# Patient Record
Sex: Male | Born: 2000
Health system: Southern US, Community
[De-identification: ages and names within clinical notes are randomized; demographics above are authoritative.]

## PROBLEM LIST (undated history)

## (undated) DIAGNOSIS — S5292XA Unspecified fracture of left forearm, initial encounter for closed fracture: Secondary | ICD-10-CM

## (undated) DIAGNOSIS — S62501A Fracture of unspecified phalanx of right thumb, initial encounter for closed fracture: Secondary | ICD-10-CM

## (undated) DIAGNOSIS — I517 Cardiomegaly: Secondary | ICD-10-CM

## (undated) DIAGNOSIS — S060XAA Concussion with loss of consciousness status unknown, initial encounter: Secondary | ICD-10-CM

## (undated) DIAGNOSIS — S060X9A Concussion with loss of consciousness of unspecified duration, initial encounter: Secondary | ICD-10-CM

## (undated) HISTORY — DX: Concussion with loss of consciousness status unknown, initial encounter: S06.0XAA

## (undated) HISTORY — DX: Concussion with loss of consciousness of unspecified duration, initial encounter: S06.0X9A

---
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
## 2020-11-15 ENCOUNTER — Encounter: Payer: Self-pay | Admitting: Family Medicine

## 2020-11-15 ENCOUNTER — Ambulatory Visit (INDEPENDENT_AMBULATORY_CARE_PROVIDER_SITE_OTHER): Admitting: Family Medicine

## 2020-11-15 ENCOUNTER — Other Ambulatory Visit: Payer: Self-pay

## 2020-11-15 VITALS — BP 122/68 | HR 60 | Temp 98.1°F | Resp 14 | Ht 69.0 in | Wt 154.0 lb

## 2020-11-15 DIAGNOSIS — R140 Abdominal distension (gaseous): Secondary | ICD-10-CM | POA: Diagnosis not present

## 2020-11-15 NOTE — Progress Notes (Signed)
Subjective:    Patient ID: Benjamin Bradshaw, male    DOB: 2000-06-03, 20 y.o.   MRN: [id number]  HPI For 3 days, the patient reports left-sided abdominal fullness and bloating.  He reports excessive gas.  He reports some nausea.  He reports some intestinal pain with defecation.  He states that he still having bowel movements though not quite as many as normal.  He feels full and bloated.  On exam today his abdomen is soft nontender nondistended however his bowel sounds are diminished.  There is no guarding.  There is no rebound.  His abdomen is soft.  He denies any hematuria or hematochezia.  He denies any melena.  He denies any dysuria.  He denies any vomiting.  He denies any fevers or chills.  He denies any recent illness. Past Medical History:  Diagnosis Date   Concussion    Enlarged heart    per mom, diagnosed on CXR, Evaluated with nml echo at [facility]   Fracture of thumb, right, closed    Left radial fracture    No past surgical history on file. Current Outpatient Medications on File Prior to Visit  Medication Sig Dispense Refill   EPINEPHrine 0.3 mg/0.3 mL IJ SOAJ injection Inject 0.3 mLs (0.3 mg total) into the muscle once. (Patient taking differently: Inject 0.3 mg into the muscle as needed (for allergic reactions). ) 2 Device 2   No current facility-administered medications on file prior to visit.   Allergies  Allergen Reactions   Nutritional Supplements     swelling   Other Swelling    Yellow jackets   Zyrtec [Cetirizine Hcl]     Hives    Social History   Socioeconomic History   Marital status: Single    Spouse name: Not on file   Number of children: Not on file   Years of education: Not on file   Highest education level: Not on file  Occupational History   Not on file  Tobacco Use   Smoking status: Never   Smokeless tobacco: Never  Substance and Sexual Activity   Alcohol use: No   Drug use: No   Sexual activity: Never  Other Topics Concern   Not on file   Social History Narrative   Not on file   Social Determinants of Health   Financial Resource Strain: Not on file  Food Insecurity: Not on file  Transportation Needs: Not on file  Physical Activity: Not on file  Stress: Not on file  Social Connections: Not on file  Intimate Partner Violence: Not on file     Review of Systems  All other systems reviewed and are negative.     Objective:   Physical Exam Vitals reviewed.  Constitutional:      General: He is not in acute distress.    Appearance: Normal appearance. He is well-developed and normal weight. He is not ill-appearing, toxic-appearing or diaphoretic.  HENT:     Head: Normocephalic and atraumatic.     Right Ear: Tympanic membrane, ear canal and external ear normal.     Left Ear: Tympanic membrane, ear canal and external ear normal.     Nose: Nose normal. No congestion or rhinorrhea.     Mouth/Throat:     Mouth: Mucous membranes are moist.     Pharynx: Oropharynx is clear. No oropharyngeal exudate.  Eyes:     General: No scleral icterus.       Right eye: No discharge.  Left eye: No discharge.     Conjunctiva/sclera: Conjunctivae normal.     Pupils: Pupils are equal, round, and reactive to light.  Neck:     Vascular: No JVD.  Cardiovascular:     Rate and Rhythm: Normal rate and regular rhythm.     Heart sounds: Normal heart sounds. No murmur heard.   No friction rub. No gallop.  Pulmonary:     Effort: Pulmonary effort is normal. No respiratory distress.     Breath sounds: Normal breath sounds. No stridor. No wheezing or rales.  Chest:     Chest wall: No tenderness.  Abdominal:     General: Abdomen is flat. There is no distension.     Palpations: Abdomen is soft. There is no mass.     Tenderness: There is no abdominal tenderness. There is no guarding or rebound.     Hernia: No hernia is present.  Musculoskeletal:     Cervical back: Neck supple.  Lymphadenopathy:     Cervical: No cervical adenopathy.   Neurological:     Mental Status: He is alert and oriented to person, place, and time.     Motor: No abnormal muscle tone.          Assessment & Plan:  Bloating - Plan: DG Abd 2 Views I suspect his subjective abdominal distention and bloating is due to constipation.  His exam today is completely normal.  There is no evidence of an acute abdomen.  I have recommended abdominal x-ray to evaluate further to confirm my suspicion.  He can also try MiraLAX 17 g in 8 ounces of water p.o. twice daily.  After he has a good "cleanout bowel movement", I would suspect that the bloating and pain will improve.  If not he is to let me know.  If I do not hear back from the patient I will assume that the MiraLAX samples that I gave him helped.

## 2020-11-16 ENCOUNTER — Ambulatory Visit (HOSPITAL_COMMUNITY)
Admission: RE | Admit: 2020-11-16 | Discharge: 2020-11-16 | Disposition: A | Discharge status: Home / Self Care | Source: Ambulatory Visit | Attending: Family Medicine | Admitting: Family Medicine

## 2020-11-16 ENCOUNTER — Telehealth: Payer: Self-pay

## 2020-11-16 DIAGNOSIS — R140 Abdominal distension (gaseous): Secondary | ICD-10-CM | POA: Diagnosis not present

## 2020-11-16 NOTE — Telephone Encounter (Signed)
Pt called in stating that he was prescribed miralax at his appt yesterday. Pt stated that he has taken the miralax and has had no relief. Pt wanted some advice if there is anything else he can do. Please advise.  Cb#: [phone]

## 2020-11-16 NOTE — Telephone Encounter (Signed)
Received call from patient.   Reports that he continues to have increased gas and bloating. States that he has taken 3 of the miralax doses today with no relief.   Inquired if he could take Mag Citrate. Given advise on how to use and to remain at home with use.   Advised if Sx are not improved in AM, contact office.

## 2020-11-17 ENCOUNTER — Encounter: Payer: Self-pay | Admitting: Family Medicine

## 2020-11-17 NOTE — Telephone Encounter (Signed)
Patient called to speak with nurse; prescribed meds not working for him. Please advise at [phone].

## 2020-11-17 NOTE — Telephone Encounter (Signed)
Received call from patient.   Reports that he took Miralax 4x daily and Magnesium oxide as he was not able to find Magnesium Citrate liquid.   States that he has also taken Ex-Lax with no relief.   Advised to continue Miralax. Advised to use OTC Fleet Enema.   Awaiting X-ray report.

## 2020-11-17 NOTE — Telephone Encounter (Signed)
Received following message:  Benjamin Bradshaw   4:42 PM Note Patient called to speak with nurse; prescribed meds not working for him. Please advise at [phone].      Please advise.

## 2020-11-17 NOTE — Telephone Encounter (Signed)
This encounter was created in error - please disregard.

## 2020-11-18 ENCOUNTER — Other Ambulatory Visit: Payer: Self-pay | Admitting: *Deleted

## 2020-11-18 ENCOUNTER — Ambulatory Visit (INDEPENDENT_AMBULATORY_CARE_PROVIDER_SITE_OTHER): Admitting: Family Medicine

## 2020-11-18 ENCOUNTER — Other Ambulatory Visit: Payer: Self-pay

## 2020-11-18 VITALS — BP 120/68 | HR 66 | Temp 98.2°F | Resp 16 | Ht 69.0 in | Wt 151.0 lb

## 2020-11-18 DIAGNOSIS — K589 Irritable bowel syndrome without diarrhea: Secondary | ICD-10-CM

## 2020-11-18 DIAGNOSIS — R140 Abdominal distension (gaseous): Secondary | ICD-10-CM

## 2020-11-18 MED ORDER — DICYCLOMINE HCL 20 MG PO TABS: 20.0000 mg | ORAL_TABLET | Freq: Four times a day (QID) | ORAL | 0 refills | Status: DC | PRN

## 2020-11-18 NOTE — Progress Notes (Signed)
Subjective:    Patient ID: Benjamin Bradshaw, male    DOB: Dec 13, 2000, 20 y.o.   MRN: [id number]  HPI 11/15/20 For 3 days, the patient reports left-sided abdominal fullness and bloating.  He reports excessive gas.  He reports some nausea.  He reports some intestinal pain with defecation.  He states that he still having bowel movements though not quite as many as normal.  He feels full and bloated.  On exam today his abdomen is soft nontender nondistended however his bowel sounds are diminished.  There is no guarding.  There is no rebound.  His abdomen is soft.  He denies any hematuria or hematochezia.  He denies any melena.  He denies any dysuria.  He denies any vomiting.  He denies any fevers or chills.  He denies any recent illness. At that time, my plan was:  I suspect his subjective abdominal distention and bloating is due to constipation.  His exam today is completely normal.  There is no evidence of an acute abdomen.  I have recommended abdominal x-ray to evaluate further to confirm my suspicion.  He can also try MiraLAX 17 g in 8 ounces of water p.o. twice daily.  After he has a good "cleanout bowel movement", I would suspect that the bloating and pain will improve.  If not he is to let me know.  If I do not hear back from the patient I will assume that the MiraLAX samples that I gave him helped.  11/18/20 X-ray was normal.  There was no evidence of constipation or obstruction.  Patient continues to report bloating.  He continues to have occasional nausea.  He also reports crampy left-sided abdominal pain.  Today his abdomen is soft nondistended only minimally tender with no guarding and no rebound.  He has normal bowel sounds.  He denies any fevers or chills or hematochezia or melena. Past Medical History:  Diagnosis Date   Concussion    Enlarged heart    per mom, diagnosed on CXR, Evaluated with nml echo at [facility]   Fracture of thumb, right, closed    Left radial fracture    No past  surgical history on file. No current outpatient medications on file prior to visit.   No current facility-administered medications on file prior to visit.   Allergies  Allergen Reactions   Nutritional Supplements     swelling   Other Swelling    Yellow jackets   Zyrtec [Cetirizine Hcl]     Hives    Social History   Socioeconomic History   Marital status: Single    Spouse name: Not on file   Number of children: Not on file   Years of education: Not on file   Highest education level: Not on file  Occupational History   Not on file  Tobacco Use   Smoking status: Never   Smokeless tobacco: Never  Substance and Sexual Activity   Alcohol use: No   Drug use: No   Sexual activity: Never  Other Topics Concern   Not on file  Social History Narrative   Not on file   Social Determinants of Health   Financial Resource Strain: Not on file  Food Insecurity: Not on file  Transportation Needs: Not on file  Physical Activity: Not on file  Stress: Not on file  Social Connections: Not on file  Intimate Partner Violence: Not on file     Review of Systems  All other systems reviewed and are negative.  Objective:   Physical Exam Vitals reviewed.  Constitutional:      General: He is not in acute distress.    Appearance: Normal appearance. He is well-developed and normal weight. He is not ill-appearing, toxic-appearing or diaphoretic.  HENT:     Head: Normocephalic and atraumatic.     Right Ear: Tympanic membrane, ear canal and external ear normal.     Left Ear: Tympanic membrane, ear canal and external ear normal.     Nose: Nose normal. No congestion or rhinorrhea.     Mouth/Throat:     Mouth: Mucous membranes are moist.     Pharynx: Oropharynx is clear. No oropharyngeal exudate.  Eyes:     General: No scleral icterus.       Right eye: No discharge.        Left eye: No discharge.     Conjunctiva/sclera: Conjunctivae normal.     Pupils: Pupils are equal, round, and  reactive to light.  Neck:     Vascular: No JVD.  Cardiovascular:     Rate and Rhythm: Normal rate and regular rhythm.     Heart sounds: Normal heart sounds. No murmur heard.   No friction rub. No gallop.  Pulmonary:     Effort: Pulmonary effort is normal. No respiratory distress.     Breath sounds: Normal breath sounds. No stridor. No wheezing or rales.  Chest:     Chest wall: No tenderness.  Abdominal:     General: Abdomen is flat. There is no distension.     Palpations: Abdomen is soft. There is no mass.     Tenderness: There is no abdominal tenderness. There is no guarding or rebound.     Hernia: No hernia is present.  Musculoskeletal:     Cervical back: Neck supple.  Lymphadenopathy:     Cervical: No cervical adenopathy.  Neurological:     Mental Status: He is alert and oriented to person, place, and time.     Motor: No abnormal muscle tone.          Assessment & Plan:  Bloating  Irritable bowel syndrome, unspecified type His exam today is reassuring.  There is no evidence of acute abdomen.  He primarily is dealing with bloating and some intestinal spasms.  Therefore, I will try to treat this with a probiotic.  Of asked him to pick up a probiotic over-the-counter and start to take this daily.  We will treat the intestinal spasms with Bentyl 20 mg every 6 hours as needed.  Reassess on Monday or Tuesday.  If his symptoms are improving he can then discontinue the Bentyl.  If he develops fever or bloody diarrhea that would be a suggestion of colitis and at that point I would want a CT scan so I encouraged him to go to the emergency room if those develop or to contact us immediately.

## 2020-11-18 NOTE — Telephone Encounter (Signed)
Call placed to patient and patient made aware.   Appointment scheduled.  

## 2021-03-30 ENCOUNTER — Telehealth (INDEPENDENT_AMBULATORY_CARE_PROVIDER_SITE_OTHER): Admitting: Nurse Practitioner

## 2021-03-30 ENCOUNTER — Other Ambulatory Visit: Payer: Self-pay

## 2021-03-30 ENCOUNTER — Ambulatory Visit (HOSPITAL_COMMUNITY)
Admission: RE | Admit: 2021-03-30 | Discharge: 2021-03-30 | Disposition: A | Discharge status: Home / Self Care | Source: Ambulatory Visit | Attending: Nurse Practitioner | Admitting: Nurse Practitioner

## 2021-03-30 ENCOUNTER — Encounter: Payer: Self-pay | Admitting: Nurse Practitioner

## 2021-03-30 ENCOUNTER — Telehealth: Payer: Self-pay

## 2021-03-30 DIAGNOSIS — R5381 Other malaise: Secondary | ICD-10-CM

## 2021-03-30 DIAGNOSIS — J329 Chronic sinusitis, unspecified: Secondary | ICD-10-CM

## 2021-03-30 DIAGNOSIS — R051 Acute cough: Secondary | ICD-10-CM | POA: Insufficient documentation

## 2021-03-30 DIAGNOSIS — B9689 Other specified bacterial agents as the cause of diseases classified elsewhere: Secondary | ICD-10-CM | POA: Diagnosis not present

## 2021-03-30 MED ORDER — AMOXICILLIN-POT CLAVULANATE 875-125 MG PO TABS: 1.0000 | ORAL_TABLET | Freq: Two times a day (BID) | ORAL | 0 refills | Status: AC

## 2021-03-30 NOTE — Progress Notes (Signed)
Subjective:    Patient ID: Benjamin Bradshaw, male    DOB: [birth year], 20 y.o.   MRN: [id number]  HPI: Benjamin Bradshaw is a 21 y.o. male presenting virtually for cough.  Chief Complaint  Patient presents with   Cough   UPPER RESPIRATORY TRACT INFECTION Onset: 3 weeks COVID-19 testing history: has not tested COVID-19 vaccination status: have had 2 COVID shots, has flu shot in November Fever: yes; subjective; thinks low grade Body aches: yes Chills: no Cough: yes; congested Pain with inspiration: no Shortness of breath: no Wheezing: no Chest pain: yes, with cough Chest tightness: no Chest congestion: yes Nasal congestion: yes Runny nose: no Post nasal drip: yes Sneezing: yes Sore throat: yes Swollen glands: yes Sinus pressure: yes; above eyes Headache: no Face pain: no Toothache: no Ear pain: no  Ear pressure:  yes; left eye   Eyes red/itching:no Eye drainage/crusting: no  Nausea: no  Vomiting: no Diarrhea: no  Change in appetite:  yes; decreased   Loss of taste/smell: no  Rash: no Fatigue: yes Sick contacts: no Strep contacts: no  Context: stable Recurrent sinusitis: no Treatments attempted: hydration, OTC Relief with OTC medications: at first, not anymore  Allergies  Allergen Reactions   Nutritional Supplements     swelling   Zyrtec [Cetirizine Hcl]     Hives     Outpatient Encounter Medications as of 03/30/2021  Medication Sig   amoxicillin-clavulanate (AUGMENTIN) 875-125 MG tablet Take 1 tablet by mouth 2 (two) times daily for 7 days.   dicyclomine (BENTYL) 20 MG tablet Take 1 tablet (20 mg total) by mouth every 6 (six) hours as needed (intestinal pain).   No facility-administered encounter medications on file as of 03/30/2021.    There are no problems to display for this patient.   Past Medical History:  Diagnosis Date   Concussion    Enlarged heart    per mom, diagnosed on CXR, Evaluated with nml echo at [facility]   Fracture of  thumb, right, closed    Left radial fracture     Relevant past medical, surgical, family and social history reviewed and updated as indicated. Interim medical history since our last visit reviewed.  Review of Systems Per HPI unless specifically indicated above     Objective:    There were no vitals taken for this visit.  Wt Readings from Last 3 Encounters:  11/18/20 151 lb (68.5 kg) (43 %, Z= -0.17)*  11/15/20 154 lb (69.9 kg) (48 %, Z= -0.05)*  05/28/19 141 lb (64 kg) (35 %, Z= -0.39)*   * Growth percentiles are based on CDC (Boys, 2-20 Years) data.    Physical Exam Vitals and nursing note reviewed.  Constitutional:      General: He is not in acute distress.    Appearance: He is not ill-appearing or toxic-appearing.  HENT:     Right Ear: External ear normal.     Left Ear: External ear normal.     Nose: Congestion present. No rhinorrhea.     Mouth/Throat:     Mouth: Mucous membranes are moist.     Pharynx: Oropharynx is clear.  Eyes:     General: No scleral icterus.       Right eye: No discharge.        Left eye: No discharge.  Cardiovascular:     Comments: Unable to assess heart sounds via virtual visit. Pulmonary:     Effort: Pulmonary effort is normal. No respiratory distress.  Comments: Unable to assess lung sounds via virtual visit.  Patient talking in complete sentences during telemedicine visit without accessory muscle use. Skin:    Coloration: Skin is not jaundiced or pale.     Findings: No erythema.  Neurological:     Mental Status: He is alert and oriented to person, place, and time.      Assessment & Plan:  1. Bacterial sinusitis Acute.  Start Augmentin twice daily for 7 days.  Start Guaifenesin to help loosen congestion, continue to push fluids, steam showers, humidifier, etc.  Follow up if symptoms do not improve over next week.  - amoxicillin-clavulanate (AUGMENTIN) 875-125 MG tablet; Take 1 tablet by mouth 2 (two) times daily for 7 days.   Dispense: 14 tablet; Refill: 0  2. Malaise Some concern with cough, length of symptoms, malaise, and new low grade fever.  Will check chest x-ray to rule out pneumonia.   - DG Chest 2 View; Future  3. Acute cough  - DG Chest 2 View; Future    Follow up plan: Return if symptoms worsen or fail to improve.   Due to the catastrophic nature of the COVID-19 pandemic, this video visit was completed soley via audio and visual contact via [facility] due to the restrictions of the COVID-19 pandemic.  All issues as above were discussed and addressed. Physical exam was done as above through visual confirmation on [employer]. If it was felt that the patient should be evaluated in the office, they were directed there. The patient verbally consented to this visit. Location of the patient: home Location of the provider: work Those involved with this call:  Provider: Cathlean Marseilles, DNP, FNP-C CMA: Darrol Angel, CMA Front Desk/Registration: Claudine Mouton  Time spent on call:  11 minutes with patient face to face via video conference. More than 50% of this time was spent in counseling and coordination of care. 16 minutes total spent in review of patient's record and preparation of their chart. I verified patient identity using two factors (patient name and date of birth). Patient consents verbally to being seen via telemedicine visit today.

## 2021-03-30 NOTE — Telephone Encounter (Signed)
-----   Message from Valentino Nose, NP sent at 03/30/2021  2:38 PM EST ----- Please notify chest x-ray does not show pneumonia.  Please complete entire course of Augmentin and let us know if symptoms are not better after treatment.

## 2021-03-30 NOTE — Telephone Encounter (Signed)
Left message for patient to call back regarding results and recommendations. °

## 2021-04-04 NOTE — Telephone Encounter (Signed)
Spoke with pt and reviewed results and recommendations. Pt voiced understanding. Nothing further needed at this time.

## 2022-01-14 IMAGING — DX DG CHEST 2V
2 series · 2 of 2 positions shown · non-contrast
Comparison: None.

CLINICAL DATA: 20-year-old male with cough and fatigue

EXAM:
CHEST - 2 VIEW

[chest pa]
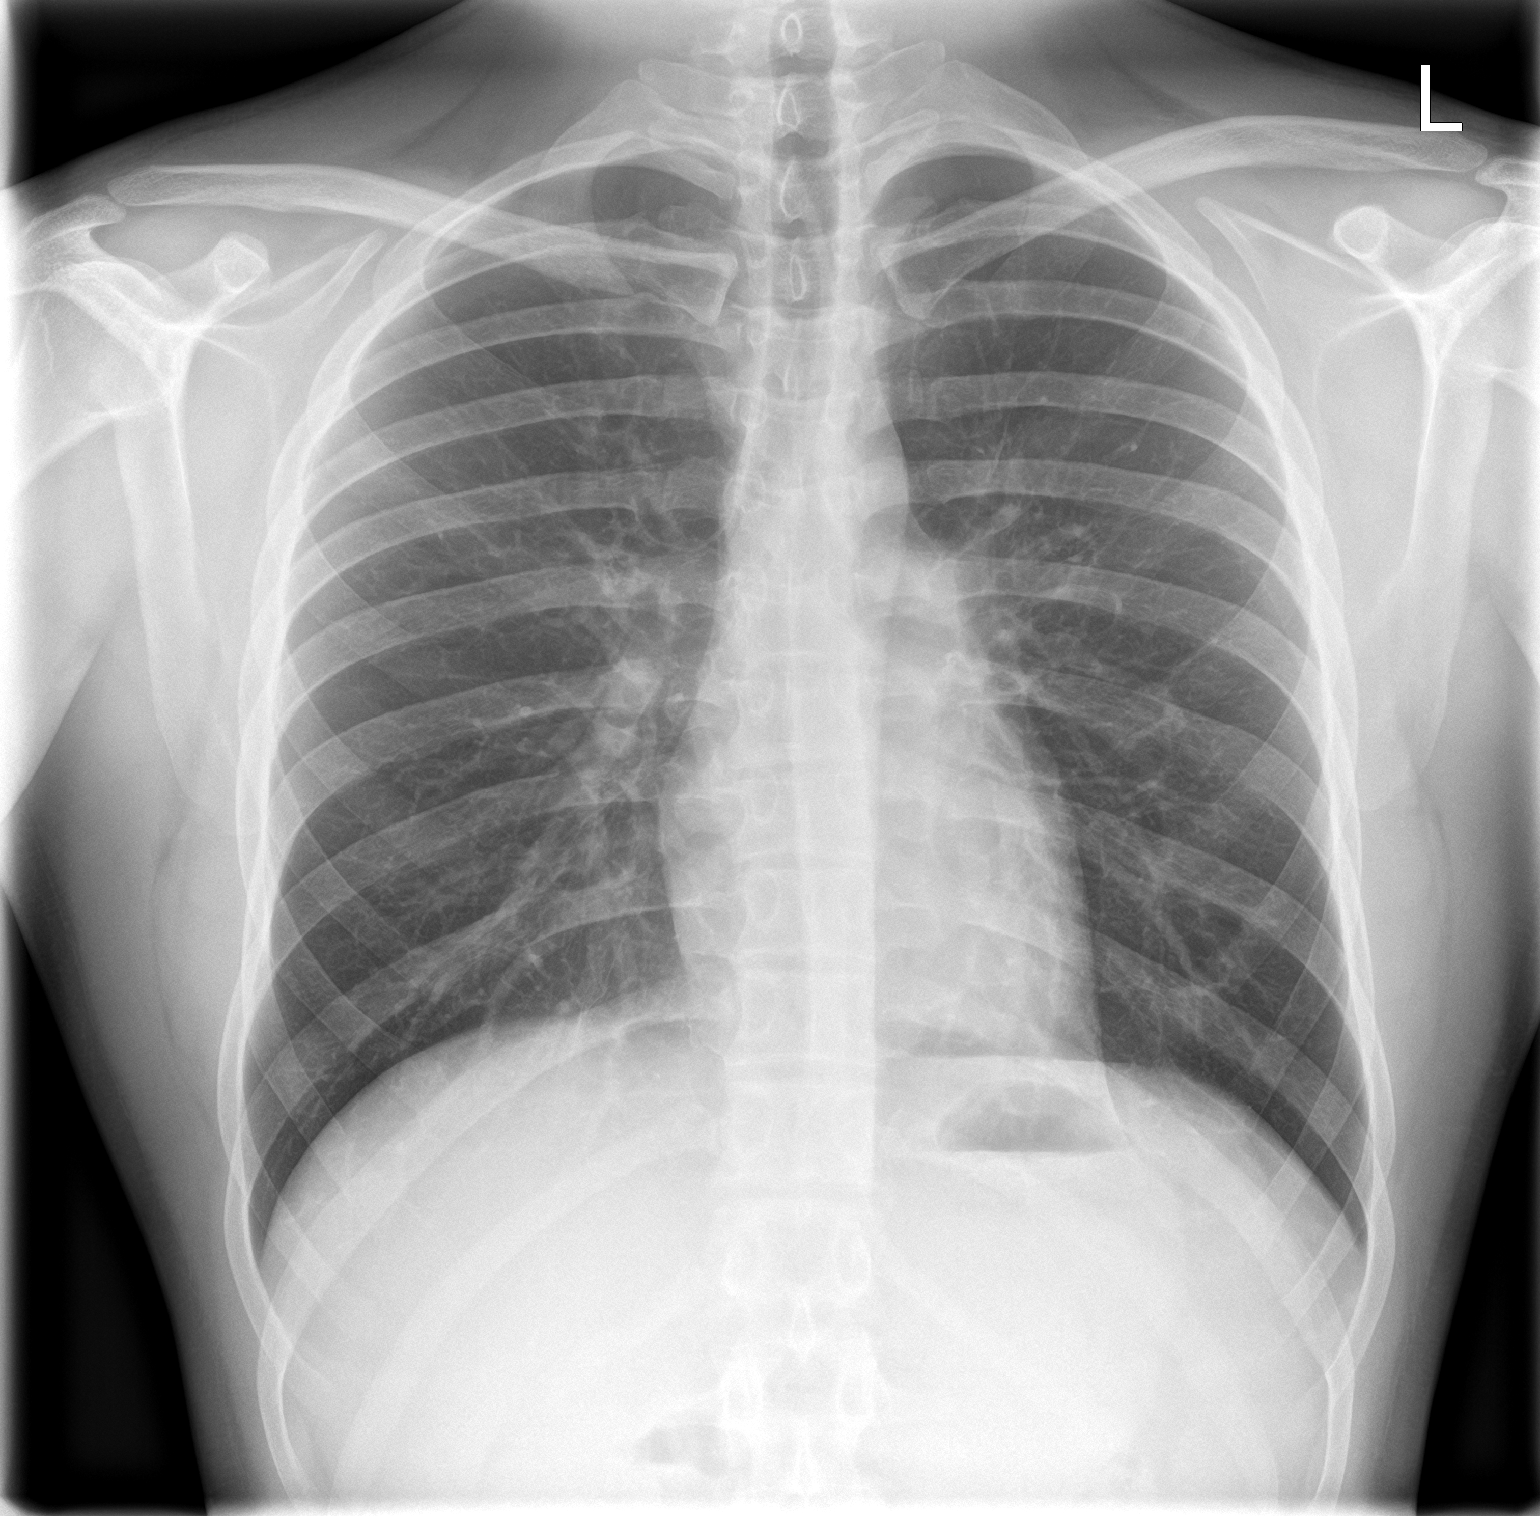

[chest lat]
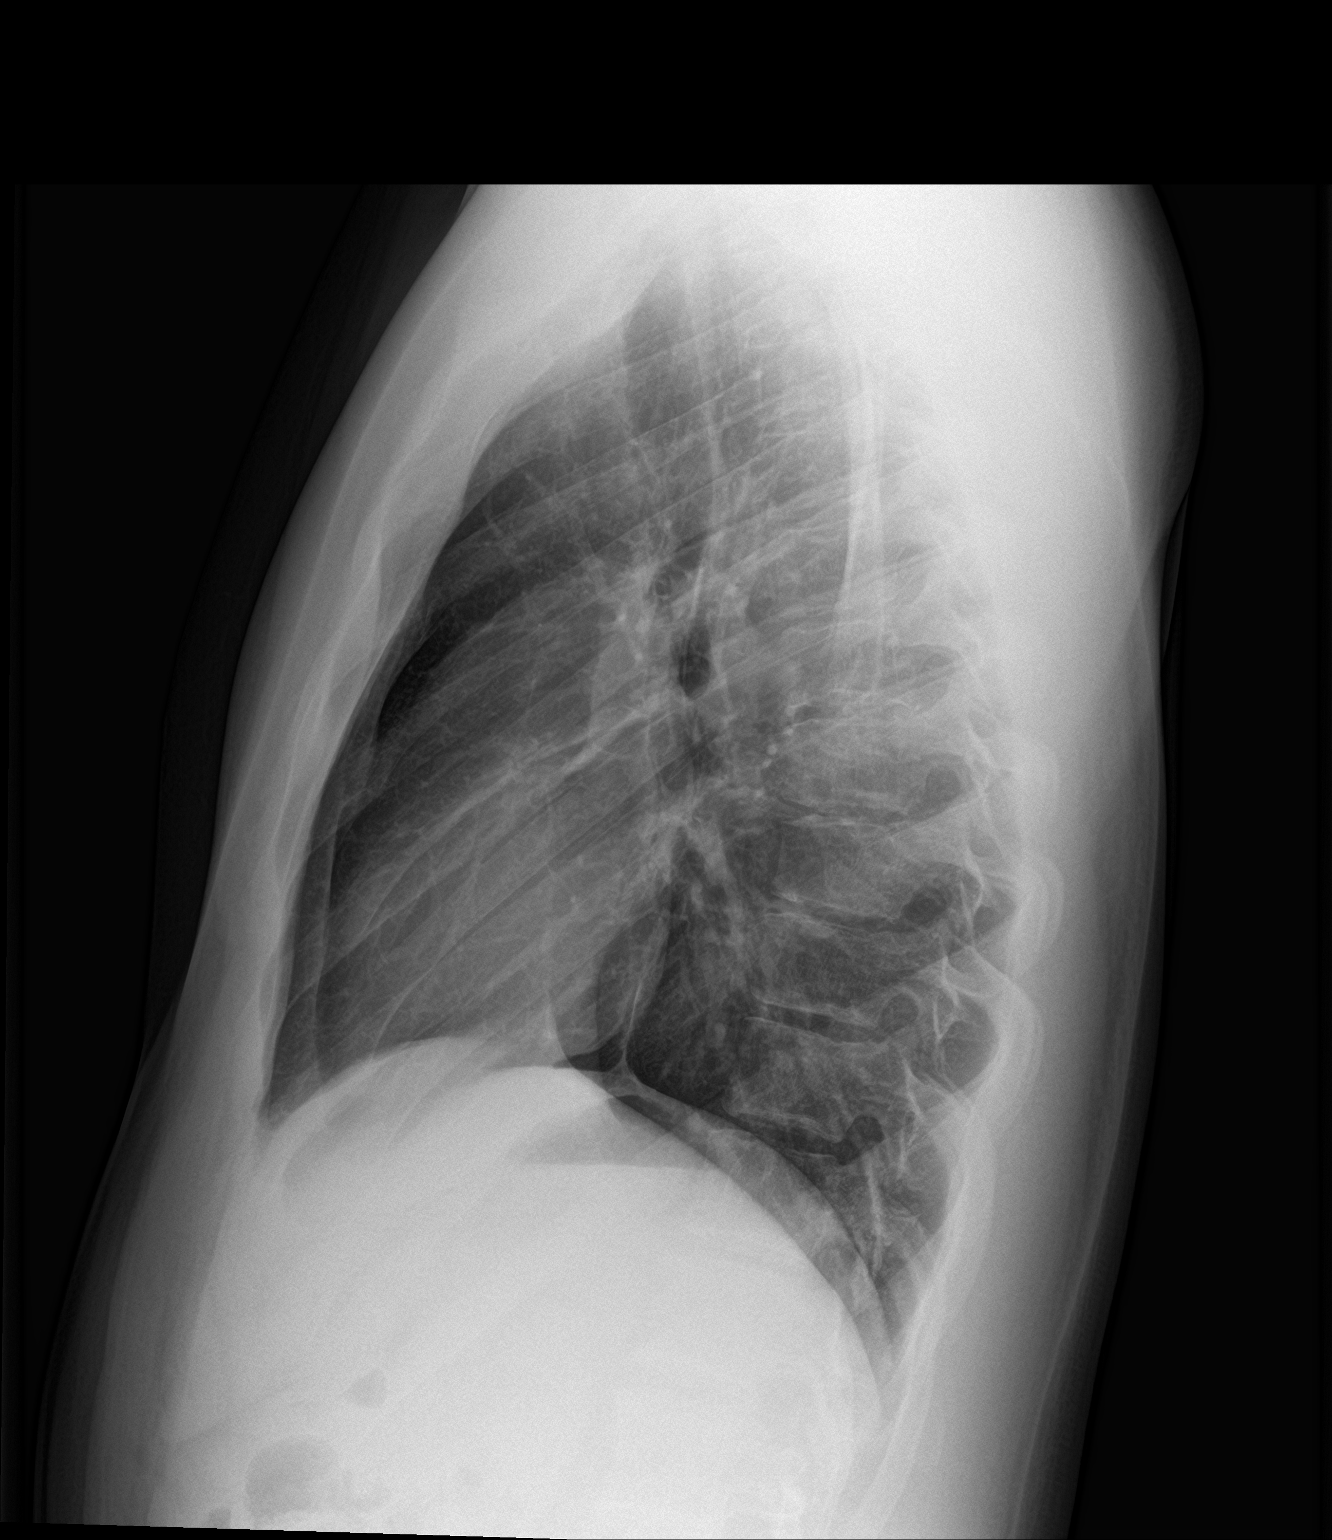

[2 of 2 positions shown; findings below may reference images not displayed]

FINDINGS: Cardiomediastinal silhouette within normal limits in size and
contour. No evidence of central vascular congestion. No interlobular
septal thickening. No pneumothorax or pleural effusion. No confluent
airspace disease.

No acute displaced fracture
IMPRESSION: No active cardiopulmonary disease.

## 2022-04-19 ENCOUNTER — Encounter: Payer: Self-pay | Admitting: Family Medicine

## 2022-04-19 ENCOUNTER — Ambulatory Visit (INDEPENDENT_AMBULATORY_CARE_PROVIDER_SITE_OTHER): Admitting: Family Medicine

## 2022-04-19 VITALS — BP 132/80 | HR 70 | Temp 98.6°F | Ht 69.0 in | Wt 189.8 lb

## 2022-04-19 DIAGNOSIS — G4452 New daily persistent headache (NDPH): Secondary | ICD-10-CM

## 2022-04-19 DIAGNOSIS — G4710 Hypersomnia, unspecified: Secondary | ICD-10-CM | POA: Diagnosis not present

## 2022-04-19 NOTE — Progress Notes (Signed)
Subjective:    Patient ID: Benjamin Bradshaw, male    DOB: 2000/05/07, 22 y.o.   MRN: [id number]  HPI Patient presents today complaining of hypersomnolence.  He states that this has been going on for years however its gotten much worse over the last few months.  He states that he gets 6 to 7 hours of sleep every day however he never feels rested.  He always wants to go to sleep.  He also finds himself falling asleep easily without warning.  He almost fell asleep driving recently.  He can easily fall asleep riding in a car or watching TV.  He often falls asleep after eating lunch at work just while flipping through his cell phone.  He denies any syncopal episodes except for once after mid duration.  He denies any seizure activity.  He does report dull constant headaches.  The headaches are located over both temples.  They are pressure-like headaches.  They have been getting increasingly worse over the last month or so.  He denies any photophobia or phonophobia.  He denies any neurologic deficits.  He does have a history of several concussions as a child.  He states that he had an intracranial hemorrhage when he was young at [facility] that did not require surgery to evacuate the blood.  He states that they were small bleeds.  Past Medical History:  Diagnosis Date   Concussion    Enlarged heart    per mom, diagnosed on CXR, Evaluated with nml echo at [facility]   Fracture of thumb, right, closed    Left radial fracture    No past surgical history on file. Current Outpatient Medications on File Prior to Visit  Medication Sig Dispense Refill   dicyclomine (BENTYL) 20 MG tablet Take 1 tablet (20 mg total) by mouth every 6 (six) hours as needed (intestinal pain). 30 tablet 0   No current facility-administered medications on file prior to visit.   Allergies  Allergen Reactions   Nutritional Supplements     swelling   Zyrtec [Cetirizine Hcl]     Hives    Social History   Socioeconomic History    Marital status: Single    Spouse name: Not on file   Number of children: Not on file   Years of education: Not on file   Highest education level: Not on file  Occupational History   Not on file  Tobacco Use   Smoking status: Never   Smokeless tobacco: Never  Substance and Sexual Activity   Alcohol use: No   Drug use: No   Sexual activity: Never  Other Topics Concern   Not on file  Social History Narrative   Not on file   Social Determinants of Health   Financial Resource Strain: Not on file  Food Insecurity: Not on file  Transportation Needs: Not on file  Physical Activity: Not on file  Stress: Not on file  Social Connections: Not on file  Intimate Partner Violence: Not on file     Review of Systems  All other systems reviewed and are negative.      Objective:   Physical Exam Vitals reviewed.  Constitutional:      General: He is not in acute distress.    Appearance: Normal appearance. He is well-developed and normal weight. He is not ill-appearing, toxic-appearing or diaphoretic.  HENT:     Head: Normocephalic and atraumatic.     Right Ear: Tympanic membrane, ear canal and external ear normal.  Left Ear: Tympanic membrane, ear canal and external ear normal.     Nose: Nose normal. No congestion or rhinorrhea.     Mouth/Throat:     Mouth: Mucous membranes are moist.     Pharynx: Oropharynx is clear. No oropharyngeal exudate.  Eyes:     General: No scleral icterus.       Right eye: No discharge.        Left eye: No discharge.     Conjunctiva/sclera: Conjunctivae normal.     Pupils: Pupils are equal, round, and reactive to light.  Neck:     Vascular: No JVD.  Cardiovascular:     Rate and Rhythm: Normal rate and regular rhythm.     Heart sounds: Normal heart sounds. No murmur heard.    No friction rub. No gallop.  Pulmonary:     Effort: Pulmonary effort is normal. No respiratory distress.     Breath sounds: Normal breath sounds. No stridor. No wheezing or  rales.  Chest:     Chest wall: No tenderness.  Abdominal:     General: Abdomen is flat. There is no distension.     Palpations: Abdomen is soft. There is no mass.     Tenderness: There is no abdominal tenderness. There is no guarding or rebound.     Hernia: No hernia is present.  Musculoskeletal:     Cervical back: Neck supple.  Lymphadenopathy:     Cervical: No cervical adenopathy.  Neurological:     Mental Status: He is alert and oriented to person, place, and time.     Motor: No abnormal muscle tone.           Assessment & Plan:  Hypersomnolence  New daily persistent headache Patient has had hypersomnolence for quite some time but is gotten worse recently almost to the point that he fell asleep driving once.  For this reason, I would recommend neurology consultation for a sleep study.  He denies any seizure-like activity.  He denies any staring spells or absence seizure's.  However I am concerned about central versus obstructive sleep apnea possibly causing his hypersomnolence.  This could also potentially explain his new persistent daily headache.  The other cause of the headache could be chronic tinnitus versus atypical migraines.  However I would like to evaluate the hypersomnolence first given that he almost fell asleep driving.  If he does have severe apnea, this could potentially treat multiple symptoms that he is explaining.  Therefore I will consult neurology

## 2022-04-25 ENCOUNTER — Other Ambulatory Visit: Payer: Self-pay

## 2022-04-25 ENCOUNTER — Telehealth: Payer: Self-pay

## 2022-04-25 NOTE — Telephone Encounter (Signed)
[facility] requesting for [facility] order for sleep study for pt. Can't use the of Referral. Also need DX and if it's an in clinic or at home study?  Fax#[phone]  Pls advice

## 2022-04-26 ENCOUNTER — Other Ambulatory Visit: Payer: Self-pay

## 2022-04-26 DIAGNOSIS — G4710 Hypersomnia, unspecified: Secondary | ICD-10-CM

## 2022-04-26 NOTE — Progress Notes (Signed)
Error entry

## 2022-04-26 NOTE — Telephone Encounter (Signed)
Home sleep study order [facility] to [facility] this morning atten Coralyn Mark

## 2022-05-18 ENCOUNTER — Ambulatory Visit: Payer: 59 | Attending: Family Medicine | Admitting: Pulmonary Disease

## 2022-05-18 DIAGNOSIS — G4710 Hypersomnia, unspecified: Secondary | ICD-10-CM | POA: Insufficient documentation

## 2022-05-30 DIAGNOSIS — G4710 Hypersomnia, unspecified: Secondary | ICD-10-CM

## 2022-05-30 NOTE — Procedures (Signed)
Patient Name: Benjamin Bradshaw, [place] Date: 05/18/2022 Gender: Male D.O.B: Feb 07, 2001 Age (years): 21 Referring Provider: Cletus Gash T. Pickard Height (inches): 69 Interpreting Physician: Kara Mead MD, ABSM Weight (lbs): 189 RPSGT: Rosebud Poles BMI: 28 MRN: [id number] Neck Size: <br> <br> <br> CLINICAL INFORMATION Sleep Study Type: HST    Indication for sleep study: N/A    Epworth Sleepiness Score: N/A  SLEEP STUDY TECHNIQUE A multi-channel overnight portable sleep study was performed. The channels recorded were: nasal airflow, thoracic respiratory movement, and oxygen saturation with a pulse oximetry. Snoring was also monitored.  MEDICATIONS Patient self administered medications include: N/A.  SLEEP ARCHITECTURE Patient was studied for 481.9 minutes. The sleep efficiency was 73.0 % and the patient was supine for 0%. The arousal index was 0.0 per hour.  RESPIRATORY PARAMETERS The overall AHI was 4.1 per hour, with a central apnea index of 0 per hour.  The oxygen nadir was 89% during sleep.    CARDIAC DATA Mean heart rate during sleep was 58.0 bpm.  IMPRESSIONS - No significant obstructive sleep apnea occurred during this study (AHI = 4.1/h). - Mild oxygen desaturation was noted during this study (Min O2 = 89%). - Patient snored 6.8% during the sleep.  DIAGNOSIS - Normal study  RECOMMENDATIONS - Avoid alcohol, sedatives and other CNS depressants that may  disrupt normal sleep architecture. - Sleep hygiene should be reviewed to assess factors that may improve sleep quality. - Weight management and regular exercise should be initiated or continued. - Consider other causes of hypersomnolence in this age group such as circadian rhythm disorder or narcolepsy. Patient may benefit from in-lab study   Kara Mead MD Board Certified in [place]

## 2022-06-21 ENCOUNTER — Telehealth: Payer: Self-pay

## 2022-06-21 NOTE — Telephone Encounter (Signed)
Pt called in stating that he wanted to discuss the results of his sleep study. Please advise  Cb#: [phone]

## 2022-06-26 ENCOUNTER — Ambulatory Visit (HOSPITAL_COMMUNITY)
Admission: RE | Admit: 2022-06-26 | Discharge: 2022-06-26 | Disposition: A | Discharge status: Home / Self Care | Payer: 59 | Source: Ambulatory Visit | Attending: Family Medicine | Admitting: Family Medicine

## 2022-06-26 ENCOUNTER — Ambulatory Visit (INDEPENDENT_AMBULATORY_CARE_PROVIDER_SITE_OTHER): Payer: 59 | Admitting: Family Medicine

## 2022-06-26 VITALS — BP 142/92 | HR 76 | Temp 99.2°F | Ht 69.0 in | Wt 187.6 lb

## 2022-06-26 DIAGNOSIS — R5383 Other fatigue: Secondary | ICD-10-CM | POA: Diagnosis not present

## 2022-06-26 DIAGNOSIS — M545 Low back pain, unspecified: Secondary | ICD-10-CM | POA: Diagnosis present

## 2022-06-26 LAB — CBC WITH DIFFERENTIAL/PLATELET
Absolute Monocytes: 579 cells/uL (ref 200–950)
Eosinophils Absolute: 78 cells/uL (ref 15–500)
Hemoglobin: 15.5 g/dL (ref 13.2–17.1)
Lymphs Abs: 1872 cells/uL (ref 850–3900)
MCH: 30 pg (ref 27.0–33.0)
Monocytes Relative: 8.9 %
Neutrophils Relative %: 60.9 %
RBC: 5.17 10*6/uL (ref 4.20–5.80)
RDW: 12.5 % (ref 11.0–15.0)
WBC: 6.5 10*3/uL (ref 3.8–10.8)

## 2022-06-26 NOTE — Progress Notes (Signed)
Subjective:    Patient ID: Benjamin Bradshaw, male    DOB: 01-Jan-2001, 22 y.o.   MRN: [id number]  HPI Patient continues to report hypersomnolence and fatigue.  I will order a sleep study.  Patient had an apnea-hypopnea index of 4.1 with 33 total events and 8 hours.  Therefore he does not appear to have sleep apnea.  He reports feeling tired all the time.  He feels like he never gets enough sleep.  However he is going to sleep roughly around 10:00 and sleeping till about 5:00 every day so he is averaging about 7 hours.  He denies any insomnia.  He is concerned that there may be something else causing his fatigue.  He also reports a 3-week history of pain in his lower back.  The pain is located left of midline just above his left gluteus muscle.  He denies any radiation of the pain into his posterior left hip or down his left leg.  There is no saddle anesthesia, paresthesias, bowel or bladder incontinence or leg weakness. Past Medical History:  Diagnosis Date  . Concussion   . Enlarged heart    per mom, diagnosed on CXR, Evaluated with nml echo at [facility]  . Fracture of thumb, right, closed   . Left radial fracture    No past surgical history on file. No current outpatient medications on file prior to visit.   No current facility-administered medications on file prior to visit.   Allergies  Allergen Reactions  . Nutritional Supplements     swelling  . Zyrtec [Cetirizine Hcl]     Hives    Social History   Socioeconomic History  . Marital status: Single    Spouse name: Not on file  . Number of children: Not on file  . Years of education: Not on file  . Highest education level: Some college, no degree  Occupational History  . Not on file  Tobacco Use  . Smoking status: Never  . Smokeless tobacco: Never  Substance and Sexual Activity  . Alcohol use: No  . Drug use: No  . Sexual activity: Never  Other Topics Concern  . Not on file  Social History Narrative  . Not on file    Social Determinants of Health   Financial Resource Strain: Medium Risk (06/26/2022)   Overall Financial Resource Strain (CARDIA)   . Difficulty of Paying Living Expenses: Somewhat hard  Food Insecurity: Food Insecurity Present (06/26/2022)   Hunger Vital Sign   . Worried About Programme researcher, broadcasting/film/video in the Last Year: Never true   . Ran Out of Food in the Last Year: Sometimes true  Transportation Needs: No Transportation Needs (06/26/2022)   PRAPARE - Transportation   . Lack of Transportation (Medical): No   . Lack of Transportation (Non-Medical): No  Physical Activity: Sufficiently Active (06/26/2022)   Exercise Vital Sign   . Days of Exercise per Week: 7 days   . Minutes of Exercise per Session: 30 min  Stress: No Stress Concern Present (06/26/2022)   [employer] of Occupational Health - Occupational Stress Questionnaire   . Feeling of Stress : Only a little  Social Connections: Socially Integrated (06/26/2022)   Social Connection and Isolation Panel [NHANES]   . Frequency of Communication with Friends and Family: More than three times a week   . Frequency of Social Gatherings with Friends and Family: Once a week   . Attends Religious Services: More than 4 times per year   .  Active Member of Clubs or Organizations: Yes   . Attends Banker Meetings: More than 4 times per year   . Marital Status: Married  Catering manager Violence: Not on file     Review of Systems  All other systems reviewed and are negative.      Objective:   Physical Exam Vitals reviewed.  Constitutional:      General: He is not in acute distress.    Appearance: Normal appearance. He is well-developed and normal weight. He is not ill-appearing, toxic-appearing or diaphoretic.  HENT:     Head: Normocephalic and atraumatic.     Right Ear: External ear normal.     Left Ear: External ear normal.     Nose: Nose normal. No congestion or rhinorrhea.     Mouth/Throat:     Mouth: Mucous membranes are  moist.     Pharynx: Oropharynx is clear. No oropharyngeal exudate.  Eyes:     General: No scleral icterus.       Right eye: No discharge.        Left eye: No discharge.     Conjunctiva/sclera: Conjunctivae normal.     Pupils: Pupils are equal, round, and reactive to light.  Neck:     Vascular: No JVD.  Cardiovascular:     Rate and Rhythm: Normal rate and regular rhythm.     Heart sounds: Normal heart sounds. No murmur heard.    No friction rub. No gallop.  Pulmonary:     Effort: Pulmonary effort is normal. No respiratory distress.     Breath sounds: Normal breath sounds. No stridor. No wheezing or rales.  Chest:     Chest wall: No tenderness.  Abdominal:     General: Abdomen is flat. There is no distension.     Palpations: Abdomen is soft. There is no mass.     Tenderness: There is no abdominal tenderness. There is no guarding or rebound.     Hernia: No hernia is present.  Musculoskeletal:     Cervical back: Neck supple.     Lumbar back: No tenderness or bony tenderness. Normal range of motion.       Back:  Lymphadenopathy:     Cervical: No cervical adenopathy.  Neurological:     Mental Status: He is alert and oriented to person, place, and time.     Motor: No abnormal muscle tone.          Assessment & Plan:  Fatigue, unspecified type - Plan: CBC with Differential/Platelet, Vitamin B12, COMPLETE METABOLIC PANEL WITH GFR, TSH, Testosterone Total,Free,Bio, Males  Acute midline low back pain without sciatica - Plan: DG Lumbar Spine Complete Sleep study did not provide explanation for his fatigue or hypersomnolence.  I do not feel that the patient has narcolepsy.  I will start of workup for biochemical causes of fatigue including a CBC, CMP, TSH, B12, and testosterone level.  If labs are normal, I feel that this would rule out hormonal causes of fatigue.  I believe the back pain is likely a pulled muscle.  Anticipate gradual improvement over the next 2 to 3 weeks but I will  obtain an x-ray of the lower back to be thorough.

## 2022-06-27 LAB — CBC WITH DIFFERENTIAL/PLATELET
Basophils Absolute: 13 cells/uL (ref 0–200)
Basophils Relative: 0.2 %
Eosinophils Relative: 1.2 %
HCT: 44.9 % (ref 38.5–50.0)
MCHC: 34.5 g/dL (ref 32.0–36.0)
MCV: 86.8 fL (ref 80.0–100.0)
MPV: 9.9 fL (ref 7.5–12.5)
Neutro Abs: 3959 cells/uL (ref 1500–7800)
Platelets: 271 10*3/uL (ref 140–400)
Total Lymphocyte: 28.8 %

## 2022-06-27 LAB — COMPLETE METABOLIC PANEL WITH GFR
AG Ratio: 2.2 (calc) (ref 1.0–2.5)
ALT: 19 U/L (ref 9–46)
AST: 15 U/L (ref 10–40)
Albumin: 4.9 g/dL (ref 3.6–5.1)
Alkaline phosphatase (APISO): 84 U/L (ref 36–130)
BUN/Creatinine Ratio: 15 (calc) (ref 6–22)
BUN: 22 mg/dL (ref 7–25)
CO2: 26 mmol/L (ref 20–32)
Calcium: 9.4 mg/dL (ref 8.6–10.3)
Chloride: 103 mmol/L (ref 98–110)
Creat: 1.44 mg/dL — ABNORMAL HIGH (ref 0.60–1.24)
Globulin: 2.2 g/dL (calc) (ref 1.9–3.7)
Glucose, Bld: 88 mg/dL (ref 65–99)
Potassium: 4.6 mmol/L (ref 3.5–5.3)
Sodium: 140 mmol/L (ref 135–146)
Total Bilirubin: 0.4 mg/dL (ref 0.2–1.2)
Total Protein: 7.1 g/dL (ref 6.1–8.1)
eGFR: 71 mL/min/{1.73_m2} (ref >=60)

## 2022-06-27 LAB — VITAMIN B12: Vitamin B-12: 640 pg/mL (ref 200–1100)

## 2022-06-27 LAB — TESTOSTERONE TOTAL,FREE,BIO, MALES
Albumin: 4.9 g/dL (ref 3.6–5.1)
Sex Hormone Binding: 25 nmol/L (ref 10–50)
Testosterone, Bioavailable: 120.1 ng/dL (ref 110.0–575.0)
Testosterone, Free: 53.9 pg/mL (ref 46.0–224.0)
Testosterone: 347 ng/dL (ref 250–827)

## 2022-06-27 LAB — TSH: TSH: 1.8 mIU/L (ref 0.40–4.50)

## 2022-06-28 ENCOUNTER — Other Ambulatory Visit: Payer: Self-pay

## 2022-06-28 DIAGNOSIS — M545 Low back pain, unspecified: Secondary | ICD-10-CM

## 2022-06-28 DIAGNOSIS — R7989 Other specified abnormal findings of blood chemistry: Secondary | ICD-10-CM

## 2022-07-14 ENCOUNTER — Emergency Department (HOSPITAL_COMMUNITY): Payer: 59

## 2022-07-14 ENCOUNTER — Encounter (HOSPITAL_COMMUNITY): Payer: Self-pay

## 2022-07-14 ENCOUNTER — Emergency Department (HOSPITAL_COMMUNITY)
Admission: EM | Admit: 2022-07-14 | Discharge: 2022-07-14 | Disposition: A | Discharge status: Home / Self Care | Payer: 59 | Attending: Emergency Medicine | Admitting: Emergency Medicine

## 2022-07-14 DIAGNOSIS — X501XXA Overexertion from prolonged static or awkward postures, initial encounter: Secondary | ICD-10-CM | POA: Insufficient documentation

## 2022-07-14 DIAGNOSIS — S92155A Nondisplaced avulsion fracture (chip fracture) of left talus, initial encounter for closed fracture: Secondary | ICD-10-CM | POA: Insufficient documentation

## 2022-07-14 DIAGNOSIS — S92002A Unspecified fracture of left calcaneus, initial encounter for closed fracture: Secondary | ICD-10-CM | POA: Diagnosis not present

## 2022-07-14 DIAGNOSIS — Y9344 Activity, trampolining: Secondary | ICD-10-CM | POA: Diagnosis not present

## 2022-07-14 DIAGNOSIS — W19XXXA Unspecified fall, initial encounter: Secondary | ICD-10-CM

## 2022-07-14 DIAGNOSIS — S99912A Unspecified injury of left ankle, initial encounter: Secondary | ICD-10-CM | POA: Diagnosis present

## 2022-07-14 MED ORDER — OXYCODONE-ACETAMINOPHEN 5-325 MG PO TABS
1.0000 | ORAL_TABLET | Freq: Once | ORAL | Status: AC
  Administered 2022-07-14: 1 via ORAL
  Filled 2022-07-14: qty 1

## 2022-07-14 MED ORDER — OXYCODONE-ACETAMINOPHEN 5-325 MG PO TABS: 1.0000 | ORAL_TABLET | Freq: Four times a day (QID) | ORAL | 0 refills | Status: AC | PRN

## 2022-07-14 NOTE — Progress Notes (Signed)
Orthopedic Tech Progress Note Patient Details:  Benjamin Bradshaw [birth year] [id number]  Ortho Devices Type of Ortho Device: Post (short leg) splint, Stirrup splint, Other (comment) Ortho Device/Splint Location: lle bulky jones under splint Ortho Device/Splint Interventions: Ordered, Application, Adjustment  The ed tech held for me. Post Interventions Patient Tolerated: Well Instructions Provided: Care of device, Adjustment of device  [name] Post 07/14/2022, 11:31 PM

## 2022-07-14 NOTE — ED Triage Notes (Signed)
Pt came in via POV d/t jumping on trampoline & when he landed his Lt ankle bent into the side & he heard a loud pop. A/Ox4, rates pain 4/10, a large knot noted to the top of that foot & pt re-ports decreased sensation.

## 2022-07-14 NOTE — Discharge Instructions (Addendum)
It was a pleasure taking care of you here in the emergency department today  You broke part of your heel bone as well as development from it called the talus.  These are minor fractures however you placed you in a splint and crutches.  Make sure to ice and elevate the leg.  Do not walk on it.  Follow-up with orthopedic doctor.  I have listed on your discharge paperwork if you do not have 1.  Return for new or worsening symptoms

## 2022-07-14 NOTE — ED Notes (Signed)
Ortho tech called 

## 2022-07-14 NOTE — ED Provider Notes (Signed)
[place] EMERGENCY DEPARTMENT AT [facility] Provider Note   CSN: [id number] Arrival date & time: 07/14/22  1833    History  Chief Complaint  Patient presents with   Lt Ankle Pain    Benjamin Bradshaw is a 22 y.o. male with no significant past medical history here for evaluation of left ankle and foot pain.  Was at a trampoline park when he landed "wrong".  Felt a pop sensation to lateral aspect foot and ankle and noted a large "knot".  He has increased pain.  Unable to ambulate.  Range of motion decreased secondary to pain.  Decreased sensation over area of swelling to the dorsal aspect left foot.  HPI     Home Medications Prior to Admission medications   Medication Sig Start Date End Date Taking? Authorizing Provider  oxyCODONE-acetaminophen (PERCOCET/ROXICET) 5-325 MG tablet Take 1 tablet by mouth every 6 (six) hours as needed for severe pain. 07/14/22  Yes Starlet Gallentine A, PA-C      Allergies    Nutritional supplements and Zyrtec [cetirizine hcl]    Review of Systems   Review of Systems  Constitutional: Negative.   HENT: Negative.    Respiratory: Negative.    Cardiovascular: Negative.   Gastrointestinal: Negative.   Genitourinary: Negative.   Musculoskeletal:        Left foot / ankle pain  Skin: Negative.   Neurological: Negative.   All other systems reviewed and are negative.   Physical Exam Updated Vital Signs BP 129/69 (BP Location: Left Arm)   Pulse 65   Temp 98 F (36.7 C) (Oral)   Resp 18   SpO2 97%  Physical Exam Vitals and nursing note reviewed.  Constitutional:      General: He is not in acute distress.    Appearance: He is well-developed. He is not ill-appearing, toxic-appearing or diaphoretic.  HENT:     Head: Atraumatic.  Eyes:     Pupils: Pupils are equal, round, and reactive to light.  Cardiovascular:     Rate and Rhythm: Normal rate and regular rhythm.     Pulses: Normal pulses.          Radial pulses are 2+ on the right  side and 2+ on the left side.       Dorsalis pedis pulses are 2+ on the left side.       Posterior tibial pulses are 2+ on the left side.  Pulmonary:     Effort: Pulmonary effort is normal. No respiratory distress.  Abdominal:     General: There is no distension.     Palpations: Abdomen is soft.  Musculoskeletal:        General: Normal range of motion.     Cervical back: Normal range of motion and neck supple.     Comments: Soft tissue swelling to dorsum left lateral foot foot.  No open skin, compartments soft.  Diffuse tenderness lateral aspect foot and lateral malleolus.  Wiggles toes.  Nontender midshaft, proximal tib-fib  Skin:    General: Skin is warm and dry.     Capillary Refill: Capillary refill takes less than 2 seconds.     Comments: No erythema, warmth, fluctuance or induration. No open skin  Neurological:     General: No focal deficit present.     Mental Status: He is alert and oriented to person, place, and time.     Comments: Intact sensation Unable to ambulate 2/2 pain    ED Results / Procedures /  Treatments   Labs (all labs ordered are listed, but only abnormal results are displayed) Labs Reviewed - No data to display  EKG None  Radiology CT Foot Left Wo Contrast  Result Date: 07/14/2022 CLINICAL DATA:  Trauma, evaluate for fracture EXAM: CT OF THE LEFT FOOT WITHOUT CONTRAST TECHNIQUE: Multidetector CT imaging of the left foot was performed according to the standard protocol. Multiplanar CT image reconstructions were also generated. RADIATION DOSE REDUCTION: This exam was performed according to the departmental dose-optimization program which includes automated exposure control, adjustment of the mA and/or kV according to patient size and/or use of iterative reconstruction technique. COMPARISON:  Left foot x-ray same day FINDINGS: Bones/Joint/Cartilage There is a small acute comminuted nondisplaced fracture of the anterior lateral aspect of the calcaneus at the  calcaneocuboid joint. There is also a small avulsion fracture along the anterior aspect of the distal talus image 8/95. No significant joint effusion. Ligaments Suboptimally assessed by CT. Muscles and Tendons No focal hematoma. There is deep fascial plane edema adjacent to fractures. Soft tissues There is soft tissue swelling and likely small amount of hematoma over the dorsal lateral aspect of the proximal foot. IMPRESSION: 1. Small acute fracture of the anterior lateral aspect of the calcaneus at the calcaneocuboid joint. 2. Small avulsion fracture along the anterior aspect of the distal talus. 3. Soft tissue swelling and likely small amount of hematoma over the dorsal lateral aspect of the proximal foot. Electronically Signed   By: Darliss Cheney M.D.   On: 07/14/2022 22:13   DG Ankle Left Port  Result Date: 07/14/2022 CLINICAL DATA:  Injury. EXAM: PORTABLE LEFT ANKLE - 2 VIEW COMPARISON:  None Available. FINDINGS: No definite fracture seen. However there is a calcific density in the dorsal soft tissues adjacent to the talus. It is unclear whether this represents a radio occult fracture with unknown donor site or an ossicle. There is surrounding soft tissue swelling. IMPRESSION: 1. No definite fracture line seen. 2. Calcific density in the dorsal soft tissues adjacent to the talus. It is unclear whether this represents a radio occult fracture with unknown donor site or an ossicle. Please correlate to the point tenderness. If necessary, CT or MRI of the foot may be considered. Electronically Signed   By: Ted Mcalpine M.D.   On: 07/14/2022 19:39   DG Foot Complete Left  Result Date: 07/14/2022 CLINICAL DATA:  Injury. EXAM: LEFT FOOT - COMPLETE 3+ VIEW COMPARISON:  None Available. FINDINGS: There is no evidence of fracture or dislocation. There is no evidence of arthropathy or other focal bone abnormality. Soft tissues are unremarkable. IMPRESSION: Negative. Electronically Signed   By: Ted Mcalpine M.D.   On: 07/14/2022 19:36    Procedures .Splint Application  Date/Time: 07/14/2022 10:47 PM  Performed by: Linwood Dibbles, PA-C Authorized by: Linwood Dibbles, PA-C   Consent:    Consent obtained:  Verbal   Consent given by:  Patient   Risks, benefits, and alternatives were discussed: yes     Risks discussed:  Discoloration, numbness, pain and swelling   Alternatives discussed:  Referral, observation, alternative treatment, delayed treatment and no treatment Universal protocol:    Procedure explained and questions answered to patient or proxy's satisfaction: yes     Relevant documents present and verified: yes     Test results available: yes     Imaging studies available: yes     Required blood products, implants, devices, and special equipment available: yes     Site/side  marked: yes     Immediately prior to procedure a time out was called: yes     Patient identity confirmed:  Verbally with patient Pre-procedure details:    Distal neurologic exam:  Normal   Distal perfusion: distal pulses strong and brisk capillary refill   Procedure details:    Location:  Foot   Foot location:  L foot   Strapping: yes     Cast type:  Short leg   Splint type:  Short leg and ankle stirrup   Supplies:  Fiberglass, elastic bandage and cotton padding   Attestation: Splint applied and adjusted personally by me       Medications Ordered in ED Medications  oxyCODONE-acetaminophen (PERCOCET/ROXICET) 5-325 MG per tablet 1 tablet (1 tablet Oral Given 07/14/22 2029)    ED Course/ Medical Decision Making/ A&P    22 year old here for evaluation of left foot and ankle pain after fall while at a trampoline park.  Patient neurovascularly intact.  Has diffuse pain and swelling about his left lateral malleolus over his ATFL left dorsal lateral foot with some overlying swelling.  He has no breaks in skin to suggest an open fracture.  His compartments are soft.  No overlying erythema or  warmth to suggest infectious process.  Will plan on imaging and reassess pain meds given.  Imaging personally viewed and interpreted: Xray left ankle IMPRESSION:  1. No definite fracture line seen.  2. Calcific density in the dorsal soft tissues adjacent to the  talus. It is unclear whether this represents a radio occult fracture  with unknown donor site or an ossicle. Please correlate to the point  tenderness. If necessary, CT or MRI of the foot may be considered.   Xray left foot neg  Discussed results with patient.  Will plan on CT  CT left foot  Small acute fracture of the anterior lateral aspect of the  calcaneus at the calcaneocuboid joint.  2. Small avulsion fracture along the anterior aspect of the distal  talus.  3. Soft tissue swelling and likely small amount of hematoma over the  dorsal lateral aspect of the proximal foot.    Patient reassessed.  I discussed results with patient, friend.  He will be placed in a splint, crutches.  Will have him be nonweightbearing until he follows up with orthopedics.  He is agreeable.  Patient neurovascularly intact, compartments soft.  He is nontender to his midshaft, proximal tib-fib to suggest proximal fracture.  No evidence of compartment syndrome, neurovascularly intact.  No open skin to suggest open fracture.  DC home with symptomatic management close follow-up orthopedics which she is agreeable  The patient has been appropriately medically screened and/or stabilized in the ED. I have low suspicion for any other emergent medical condition which would require further screening, evaluation or treatment in the ED or require inpatient management.  Patient is hemodynamically stable and in no acute distress.  Patient able to ambulate in department prior to ED.  Evaluation does not show acute pathology that would require ongoing or additional emergent interventions while in the emergency department or further inpatient treatment.  I have  discussed the diagnosis with the patient and answered all questions.  Pain is been managed while in the emergency department and patient has no further complaints prior to discharge.  Patient is comfortable with plan discussed in room and is stable for discharge at this time.  I have discussed strict return precautions for returning to the emergency department.  Patient was encouraged  to follow-up with PCP/specialist refer to at discharge.                             Medical Decision Making Amount and/or Complexity of Data Reviewed Independent Historian: friend External Data Reviewed: labs and notes. Radiology: ordered and independent interpretation performed. Decision-making details documented in ED Course.  Risk OTC drugs. Prescription drug management. Parenteral controlled substances. Decision regarding hospitalization. Diagnosis or treatment significantly limited by social determinants of health.          Final Clinical Impression(s) / ED Diagnoses Final diagnoses:  Fall, initial encounter  Closed nondisplaced fracture of left calcaneus, unspecified portion of calcaneus, initial encounter  Closed nondisplaced avulsion fracture of left talus, initial encounter    Rx / DC Orders ED Discharge Orders          Ordered    oxyCODONE-acetaminophen (PERCOCET/ROXICET) 5-325 MG tablet  Every 6 hours PRN        07/14/22 2250              Derrion Tritz A, PA-C 07/14/22 2305    Glyn Ade, MD 07/14/22 2335

## 2022-07-16 ENCOUNTER — Ambulatory Visit (HOSPITAL_COMMUNITY)
Admission: RE | Admit: 2022-07-16 | Discharge: 2022-07-16 | Disposition: A | Discharge status: Home / Self Care | Payer: 59 | Source: Ambulatory Visit | Attending: Family Medicine | Admitting: Family Medicine

## 2022-07-16 ENCOUNTER — Telehealth: Payer: Self-pay | Admitting: Family Medicine

## 2022-07-16 ENCOUNTER — Other Ambulatory Visit: Payer: Self-pay

## 2022-07-16 ENCOUNTER — Other Ambulatory Visit

## 2022-07-16 ENCOUNTER — Telehealth: Payer: Self-pay

## 2022-07-16 DIAGNOSIS — R7989 Other specified abnormal findings of blood chemistry: Secondary | ICD-10-CM

## 2022-07-16 DIAGNOSIS — M545 Low back pain, unspecified: Secondary | ICD-10-CM

## 2022-07-16 DIAGNOSIS — S92002A Unspecified fracture of left calcaneus, initial encounter for closed fracture: Secondary | ICD-10-CM

## 2022-07-16 NOTE — Transitions of Care (Post Inpatient/ED Visit) (Signed)
   07/16/2022  Name: Benjamin Bradshaw MRN: [id number] DOB: Jan 08, 2001  Today's TOC FU Call Status:    Transition Care Management Follow-up Telephone Call Discharge Facility: [facility]) How have you been since you were released from the hospital?: Same Any questions or concerns?: Yes Patient Questions/Concerns:: regarding appointment to Othropedic Patient Questions/Concerns Addressed: Other: (pt will discuss getting a referral at appt.)  Items Reviewed: Did you receive and understand the discharge instructions provided?: Yes Medications obtained and verified?: Yes (Medications Reviewed) Any new allergies since your discharge?: No Dietary orders reviewed?: No Do you have support at home?: Yes People in Home: spouse  Home Care and Equipment/Supplies: Were Home Health Services Ordered?: No Any new equipment or medical supplies ordered?: Yes (cruthes) Were you able to get the equipment/medical supplies?: Yes Do you have any questions related to the use of the equipment/supplies?: No  Functional Questionnaire: Do you need assistance with bathing/showering or dressing?: No Do you need assistance with meal preparation?: No Do you need assistance with eating?: No Do you have difficulty maintaining continence: No Do you need assistance with getting out of bed/getting out of a chair/moving?: Yes Do you have difficulty managing or taking your medications?: No  Follow up appointments reviewed: PCP Follow-up appointment confirmed?: Yes Date of PCP follow-up appointment?: 07/19/22 Follow-up Provider: [facility] Follow-up appointment confirmed?: No Reason Specialist Follow-Up Not Confirmed: Patient has Specialist Provider Number and will Call for Appointment Do you need transportation to your follow-up appointment?: No Do you understand care options if your condition(s) worsen?: Yes-patient verbalized understanding    SIGNATURE Marrisa Kimber,CMA [facility]

## 2022-07-16 NOTE — Telephone Encounter (Signed)
This patient called and LMOM requesting to be referred to [facility]. His call back # is [phone].

## 2022-07-17 LAB — PROTEIN / CREATININE RATIO, URINE
Creatinine, Urine: 82 mg/dL (ref 20–320)
Protein/Creat Ratio: 49 mg/g creat (ref 25–148)
Protein/Creatinine Ratio: 0.049 mg/mg creat (ref 0.025–0.148)
Total Protein, Urine: 4 mg/dL — ABNORMAL LOW (ref 5–25)

## 2022-07-17 LAB — URINALYSIS, ROUTINE W REFLEX MICROSCOPIC
Bilirubin Urine: NEGATIVE
Glucose, UA: NEGATIVE
Hgb urine dipstick: NEGATIVE
Ketones, ur: NEGATIVE
Leukocytes,Ua: NEGATIVE
Nitrite: NEGATIVE
Protein, ur: NEGATIVE
Specific Gravity, Urine: 1.012 (ref 1.001–1.035)
pH: 6.5 (ref 5.0–8.0)

## 2022-07-17 LAB — BASIC METABOLIC PANEL
BUN: 12 mg/dL (ref 7–25)
CO2: 30 mmol/L (ref 20–32)
Calcium: 9.4 mg/dL (ref 8.6–10.3)
Chloride: 100 mmol/L (ref 98–110)
Creat: 0.92 mg/dL (ref 0.60–1.24)
Glucose, Bld: 95 mg/dL (ref 65–99)
Potassium: 4.4 mmol/L (ref 3.5–5.3)
Sodium: 137 mmol/L (ref 135–146)

## 2022-07-19 ENCOUNTER — Encounter: Payer: Self-pay | Admitting: Family Medicine

## 2022-07-19 ENCOUNTER — Ambulatory Visit (INDEPENDENT_AMBULATORY_CARE_PROVIDER_SITE_OTHER): Admitting: Family Medicine

## 2022-07-19 VITALS — BP 126/82 | HR 65 | Temp 97.4°F | Ht 69.0 in | Wt 184.0 lb

## 2022-07-19 DIAGNOSIS — S92002D Unspecified fracture of left calcaneus, subsequent encounter for fracture with routine healing: Secondary | ICD-10-CM

## 2022-07-19 DIAGNOSIS — M79605 Pain in left leg: Secondary | ICD-10-CM

## 2022-07-19 DIAGNOSIS — R7989 Other specified abnormal findings of blood chemistry: Secondary | ICD-10-CM

## 2022-07-19 DIAGNOSIS — S92002A Unspecified fracture of left calcaneus, initial encounter for closed fracture: Secondary | ICD-10-CM

## 2022-07-19 NOTE — Progress Notes (Signed)
Subjective:    Patient ID: Benjamin Bradshaw, male    DOB: November 08, 2000, 22 y.o.   MRN: [id number]  HPI Seen in the emergency room on April 27 after feeling a pop in his left foot while jumping on the trampoline.  Initial x-rays were negative however CT scan showed fracture: CT left foot  Small acute fracture of the anterior lateral aspect of the  calcaneus at the calcaneocuboid joint.  2. Small avulsion fracture along the anterior aspect of the distal  talus.  3. Soft tissue swelling and likely small amount of hematoma over the  dorsal lateral aspect of the proximal foot.  Patient been nonweightbearing with her previous.  He is here today for follow-up.  However I was concerned because his recent creatinine earlier this month was 1.44.  Urinalysis was completely normal.  Renal ultrasound was unremarkable.  Fortunately, recent BMP on 29 April revealed his creatinine to be back to normal: Lab on 07/16/2022  Component Date Value Ref Range Status   Glucose, Bld 07/16/2022 95  65 - 99 mg/dL Final   Comment: .            Fasting reference interval .    BUN 07/16/2022 12  7 - 25 mg/dL Final   Creat 40/98/1191 0.92  0.60 - 1.24 mg/dL Final   BUN/Creatinine Ratio 07/16/2022 SEE NOTE:  6 - 22 (calc) Final   Comment:    Not Reported: BUN and Creatinine are within    reference range. .    Sodium 07/16/2022 137  135 - 146 mmol/L Final   Potassium 07/16/2022 4.4  3.5 - 5.3 mmol/L Final   Chloride 07/16/2022 100  98 - 110 mmol/L Final   CO2 07/16/2022 30  20 - 32 mmol/L Final   Calcium 07/16/2022 9.4  8.6 - 10.3 mg/dL Final   Color, Urine 47/82/9562 YELLOW  YELLOW Final   APPearance 07/16/2022 CLEAR  CLEAR Final   Specific Gravity, Urine 07/16/2022 1.012  1.001 - 1.035 Final   pH 07/16/2022 6.5  5.0 - 8.0 Final   Glucose, UA 07/16/2022 NEGATIVE  NEGATIVE Final   Bilirubin Urine 07/16/2022 NEGATIVE  NEGATIVE Final   Ketones, ur 07/16/2022 NEGATIVE  NEGATIVE Final   Hgb urine dipstick  07/16/2022 NEGATIVE  NEGATIVE Final   Protein, ur 07/16/2022 NEGATIVE  NEGATIVE Final   Nitrite 07/16/2022 NEGATIVE  NEGATIVE Final   Leukocytes,Ua 07/16/2022 NEGATIVE  NEGATIVE Final   Creatinine, Urine 07/16/2022 82  20 - 320 mg/dL Final   Protein/Creat Ratio 07/16/2022 49  25 - 148 mg/g creat Final   Protein/Creatinine Ratio 07/16/2022 0.049  0.025 - 0.148 mg/mg creat Final   Total Protein, Urine 07/16/2022 4 (L)  5 - 25 mg/dL Final  However patient reports a cramping pain in his left calf.  The pain radiates up into his lower thigh.  I remove the patient's cast.  He has a palpable pulse 2/4 dorsalis pedis.  His capillary refill is less than 5 seconds.  The pain is a cramping pain that comes and goes.  I believe he is getting muscle spasms in his gastrocnemius muscle due to being in a cast/splint.  Past Medical History:  Diagnosis Date   Concussion    Enlarged heart    per mom, diagnosed on CXR, Evaluated with nml echo at [facility]   Fracture of thumb, right, closed    Left radial fracture    No past surgical history on file. Current Outpatient Medications on File Prior to  Visit  Medication Sig Dispense Refill   oxyCODONE-acetaminophen (PERCOCET/ROXICET) 5-325 MG tablet Take 1 tablet by mouth every 6 (six) hours as needed for severe pain. 10 tablet 0   No current facility-administered medications on file prior to visit.   Allergies  Allergen Reactions   Nutritional Supplements     swelling   Zyrtec [Cetirizine Hcl]     Hives    Social History   Socioeconomic History   Marital status: Single    Spouse name: Not on file   Number of children: Not on file   Years of education: Not on file   Highest education level: Some college, no degree  Occupational History   Not on file  Tobacco Use   Smoking status: Never   Smokeless tobacco: Never  Substance and Sexual Activity   Alcohol use: No   Drug use: No   Sexual activity: Never  Other Topics Concern   Not on file   Social History Narrative   Not on file   Social Determinants of Health   Financial Resource Strain: Medium Risk (06/26/2022)   Overall Financial Resource Strain (CARDIA)    Difficulty of Paying Living Expenses: Somewhat hard  Food Insecurity: Food Insecurity Present (06/26/2022)   Hunger Vital Sign    Worried About Running Out of Food in the Last Year: Never true    Ran Out of Food in the Last Year: Sometimes true  Transportation Needs: No Transportation Needs (06/26/2022)   PRAPARE - Administrator, Civil Service (Medical): No    Lack of Transportation (Non-Medical): No  Physical Activity: Sufficiently Active (06/26/2022)   Exercise Vital Sign    Days of Exercise per Week: 7 days    Minutes of Exercise per Session: 30 min  Stress: No Stress Concern Present (06/26/2022)   [employer] of Occupational Health - Occupational Stress Questionnaire    Feeling of Stress : Only a little  Social Connections: Socially Integrated (06/26/2022)   Social Connection and Isolation Panel [NHANES]    Frequency of Communication with Friends and Family: More than three times a week    Frequency of Social Gatherings with Friends and Family: Once a week    Attends Religious Services: More than 4 times per year    Active Member of [employer] or Organizations: Yes    Attends Engineer, structural: More than 4 times per year    Marital Status: Married  Catering manager Violence: Not on file     Review of Systems  All other systems reviewed and are negative.      Objective:   Physical Exam Vitals reviewed.  Constitutional:      General: He is not in acute distress.    Appearance: Normal appearance. He is well-developed and normal weight. He is not ill-appearing, toxic-appearing or diaphoretic.  HENT:     Head: Normocephalic and atraumatic.  Neck:     Vascular: No JVD.  Cardiovascular:     Rate and Rhythm: Normal rate and regular rhythm.     Heart sounds: Normal heart sounds. No  murmur heard.    No friction rub. No gallop.  Pulmonary:     Effort: Pulmonary effort is normal. No respiratory distress.     Breath sounds: Normal breath sounds. No stridor. No wheezing or rales.  Chest:     Chest wall: No tenderness.  Musculoskeletal:     Lumbar back: No tenderness or bony tenderness. Normal range of motion.     Left lower  leg: Tenderness and bony tenderness present. No swelling. No edema.  Neurological:     Mental Status: He is alert and oriented to person, place, and time.     Motor: No abnormal muscle tone.   Patient is in a short leg cast.  He has a splint held on his legs with Ace wraps.  Patient has palpable 2/4 dorsalis pedis pulses bilaterally.  He has cap refill less than 5 seconds in all the toes of his left foot.  There is no evidence of compartment syndrome.        Assessment & Plan:  Pain of left lower extremity - Plan: D-dimer, quantitative  Closed nondisplaced fracture of left calcaneus, unspecified portion of calcaneus, initial encounter  Elevated serum creatinine I believe the leg pain is most likely cramps.  I will check a D-dimer given the fracture and the recent immobility rule out at DVT.  If positive I will need to get an ultrasound of the left leg.  LE serum pending.  Trend and either dehydration or possibly acute status the patient was having left flank pain that day with follow-up renal ultrasound was negative.  I will consult orthopedics regarding the fracture of his left calcaneus and talus and recommended nonweightbearing until seen by orthopedics

## 2022-07-20 LAB — D-DIMER, QUANTITATIVE: D-Dimer, Quant: 0.26 mcg/mL FEU (ref <0.50)

## 2022-07-27 ENCOUNTER — Ambulatory Visit (INDEPENDENT_AMBULATORY_CARE_PROVIDER_SITE_OTHER): Admitting: Family Medicine

## 2022-07-27 ENCOUNTER — Encounter: Payer: Self-pay | Admitting: Family Medicine

## 2022-07-27 VITALS — BP 128/82 | HR 73 | Temp 98.8°F | Ht 69.0 in | Wt 185.0 lb

## 2022-07-27 DIAGNOSIS — R413 Other amnesia: Secondary | ICD-10-CM | POA: Diagnosis not present

## 2022-07-27 DIAGNOSIS — S92002A Unspecified fracture of left calcaneus, initial encounter for closed fracture: Secondary | ICD-10-CM | POA: Insufficient documentation

## 2022-07-27 DIAGNOSIS — G4700 Insomnia, unspecified: Secondary | ICD-10-CM | POA: Diagnosis not present

## 2022-07-27 MED ORDER — TRAZODONE HCL 50 MG PO TABS: 50.0000 mg | ORAL_TABLET | Freq: Every day | ORAL | 2 refills | Status: AC

## 2022-07-27 NOTE — Progress Notes (Signed)
Subjective:    Patient ID: Benjamin Bradshaw, male    DOB: 2000-09-04, 22 y.o.   MRN: [id number]  HPI Patient is a 22 year old Caucasian gentleman who has a history of several concussions when he was younger playing football.  1 was so severe that he was knocked unconscious.  He states that he had issues with his memory all throughout school however is becoming more problematic for him now.  For instance he gives the example of being at work and being unable to recall what he has done at work.  For instance, his boss will ask him what repairs he may to a certain motor and he is unable to get weaker.  He states that in school he had trouble with subjects that he was not interested in however he was able to remember dates and fax it history fine so tends to be more of an issue selective memory to things that he is not fully engaged in.  He also reports trouble sleeping.  He states that he will lay in bed and just be unable to fall asleep for several hours.  He states that he is always tired.  We done a sleep study that showed no evidence of sleep apnea however he states that he can fall asleep easily throughout the day if he sitting still or taking breaks.  He denies history of ADD Past Medical History:  Diagnosis Date   Concussion    Enlarged heart    per mom, diagnosed on CXR, Evaluated with nml echo at [facility]   Fracture of thumb, right, closed    Left radial fracture    No past surgical history on file. Current Outpatient Medications on File Prior to Visit  Medication Sig Dispense Refill   oxyCODONE-acetaminophen (PERCOCET/ROXICET) 5-325 MG tablet Take 1 tablet by mouth every 6 (six) hours as needed for severe pain. 10 tablet 0   No current facility-administered medications on file prior to visit.   Allergies  Allergen Reactions   Nutritional Supplements     swelling   Zyrtec [Cetirizine Hcl]     Hives    Social History   Socioeconomic History   Marital status: Married    Spouse  name: Not on file   Number of children: Not on file   Years of education: Not on file   Highest education level: Some college, no degree  Occupational History   Not on file  Tobacco Use   Smoking status: Never   Smokeless tobacco: Never  Substance and Sexual Activity   Alcohol use: No   Drug use: No   Sexual activity: Never  Other Topics Concern   Not on file  Social History Narrative   Not on file   Social Determinants of Health   Financial Resource Strain: Medium Risk (06/26/2022)   Overall Financial Resource Strain (CARDIA)    Difficulty of Paying Living Expenses: Somewhat hard  Food Insecurity: Food Insecurity Present (06/26/2022)   Hunger Vital Sign    Worried About Running Out of Food in the Last Year: Never true    Ran Out of Food in the Last Year: Sometimes true  Transportation Needs: No Transportation Needs (06/26/2022)   PRAPARE - Administrator, Civil Service (Medical): No    Lack of Transportation (Non-Medical): No  Physical Activity: Sufficiently Active (06/26/2022)   Exercise Vital Sign    Days of Exercise per Week: 7 days    Minutes of Exercise per Session: 30 min  Stress:  No Stress Concern Present (06/26/2022)   [employer] of Occupational Health - Occupational Stress Questionnaire    Feeling of Stress : Only a little  Social Connections: Socially Integrated (06/26/2022)   Social Connection and Isolation Panel [NHANES]    Frequency of Communication with Friends and Family: More than three times a week    Frequency of Social Gatherings with Friends and Family: Once a week    Attends Religious Services: More than 4 times per year    Active Member of [employer] or Organizations: Yes    Attends Engineer, structural: More than 4 times per year    Marital Status: Married  Catering manager Violence: Not on file     Review of Systems  All other systems reviewed and are negative.      Objective:   Physical Exam Vitals reviewed.   Constitutional:      General: He is not in acute distress.    Appearance: Normal appearance. He is well-developed and normal weight. He is not ill-appearing, toxic-appearing or diaphoretic.  HENT:     Head: Normocephalic and atraumatic.  Neck:     Vascular: No JVD.  Cardiovascular:     Rate and Rhythm: Normal rate and regular rhythm.     Heart sounds: Normal heart sounds. No murmur heard.    No friction rub. No gallop.  Pulmonary:     Effort: Pulmonary effort is normal. No respiratory distress.     Breath sounds: Normal breath sounds. No stridor. No wheezing or rales.  Chest:     Chest wall: No tenderness.  Neurological:     Mental Status: He is alert and oriented to person, place, and time.     Motor: No abnormal muscle tone.        Assessment & Plan:  Insomnia, unspecified type  Memory loss Patient denies daily headaches, neurologic deficits, seizure activity.  Therefore I do not feel that he has a space-occupying lesion or any.  I suspect that some of the memory loss could be potentially due to brain damage from previous concussions, some could be due to lack of focus and paying attention, some could be due to fatigue from not sleeping well.  We have decided to try to help his insomnia with trazodone 50 mg p.o. nightly and reassess in few weeks to see if his memory and sleep at proved.  If not, consider treatment for chronic fatigue syndrome with Provigil

## 2022-11-01 ENCOUNTER — Ambulatory Visit (INDEPENDENT_AMBULATORY_CARE_PROVIDER_SITE_OTHER): Payer: 59 | Admitting: Family Medicine

## 2022-11-01 VITALS — BP 126/72 | HR 72 | Temp 98.7°F | Ht 69.0 in | Wt 194.0 lb

## 2022-11-01 DIAGNOSIS — S92002D Unspecified fracture of left calcaneus, subsequent encounter for fracture with routine healing: Secondary | ICD-10-CM

## 2022-11-01 NOTE — Progress Notes (Signed)
Subjective:    Patient ID: Benjamin Bradshaw, male    DOB: 2000/08/11, 22 y.o.   MRN: [id number]  Sore Throat    Patient went to the emergency room April 27.  He fell off a trampoline.  Initial x-rays were negative however a CT scan revealed a small fracture in the left calcaneus.  Patient was referred to orthopedics and was last seen by orthopedics in June.  He is now almost 4 months out from the injury.  He is walking without any difficulty.  He denies any pain in his heel.  Today on examination he is able to flex and extend his ankle without any restriction in range of motion.  Patient is able to stand on his toes and some vomiting and pain in the heel.  He has no tenderness to percussion in the calcaneus.  There is no erythema or swelling.  He does report a mild sore throat.  Today on exam there is some slight erythema on the right side of the posterior oropharynx.  There is no significant lymphadenopathy Past Medical History:  Diagnosis Date   Concussion    Enlarged heart    per mom, diagnosed on CXR, Evaluated with nml echo at [facility]   Fracture of thumb, right, closed    Left radial fracture    No past surgical history on file. Current Outpatient Medications on File Prior to Visit  Medication Sig Dispense Refill   oxyCODONE-acetaminophen (PERCOCET/ROXICET) 5-325 MG tablet Take 1 tablet by mouth every 6 (six) hours as needed for severe pain. (Patient not taking: Reported on 11/01/2022) 10 tablet 0   traZODone (DESYREL) 50 MG tablet Take 1 tablet (50 mg total) by mouth at bedtime. (Patient not taking: Reported on 11/01/2022) 30 tablet 2   No current facility-administered medications on file prior to visit.   Allergies  Allergen Reactions   Nutritional Supplements     swelling   Zyrtec [Cetirizine Hcl]     Hives    Social History   Socioeconomic History   Marital status: Married    Spouse name: Not on file   Number of children: Not on file   Years of education: Not on  file   Highest education level: Some college, no degree  Occupational History   Not on file  Tobacco Use   Smoking status: Never   Smokeless tobacco: Never  Substance and Sexual Activity   Alcohol use: No   Drug use: No   Sexual activity: Never  Other Topics Concern   Not on file  Social History Narrative   Not on file   Social Determinants of Health   Financial Resource Strain: Medium Risk (06/26/2022)   Overall Financial Resource Strain (CARDIA)    Difficulty of Paying Living Expenses: Somewhat hard  Food Insecurity: Food Insecurity Present (06/26/2022)   Hunger Vital Sign    Worried About Running Out of Food in the Last Year: Never true    Ran Out of Food in the Last Year: Sometimes true  Transportation Needs: No Transportation Needs (06/26/2022)   PRAPARE - Administrator, Civil Service (Medical): No    Lack of Transportation (Non-Medical): No  Physical Activity: Sufficiently Active (06/26/2022)   Exercise Vital Sign    Days of Exercise per Week: 7 days    Minutes of Exercise per Session: 30 min  Stress: No Stress Concern Present (06/26/2022)   [employer] of Occupational Health - Occupational Stress Questionnaire    Feeling of Stress :  Only a little  Social Connections: Socially Integrated (06/26/2022)   Social Connection and Isolation Panel [NHANES]    Frequency of Communication with Friends and Family: More than three times a week    Frequency of Social Gatherings with Friends and Family: Once a week    Attends Religious Services: More than 4 times per year    Active Member of [employer] or Organizations: Yes    Attends Engineer, structural: More than 4 times per year    Marital Status: Married  Catering manager Violence: Not on file     Review of Systems  All other systems reviewed and are negative.      Objective:   Physical Exam Vitals reviewed.  Constitutional:      General: He is not in acute distress.    Appearance: Normal appearance. He  is well-developed and normal weight. He is not ill-appearing, toxic-appearing or diaphoretic.  HENT:     Head: Normocephalic and atraumatic.     Mouth/Throat:     Pharynx: Posterior oropharyngeal erythema present.  Neck:     Vascular: No JVD.  Cardiovascular:     Rate and Rhythm: Normal rate and regular rhythm.     Heart sounds: Normal heart sounds. No murmur heard.    No friction rub. No gallop.  Pulmonary:     Effort: Pulmonary effort is normal. No respiratory distress.     Breath sounds: Normal breath sounds. No stridor. No wheezing or rales.  Chest:     Chest wall: No tenderness.  Musculoskeletal:     Lumbar back: No tenderness or bony tenderness. Normal range of motion.     Left lower leg: No swelling, deformity, lacerations, tenderness or bony tenderness. No edema.  Neurological:     Mental Status: He is alert and oriented to person, place, and time.     Motor: No abnormal muscle tone.         Assessment & Plan:  Closed nondisplaced fracture of left calcaneus with routine healing, unspecified portion of calcaneus, subsequent encounter Patient is 4 months out from the initial injury and is having no pain and no effect on his range of motion.  He is able to walk, run, and jump without any pain.  Therefore I see no reason why he is not medically cleared to participate in training related to the [employer].  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  He is afebrile today and his physical exam findings are unremarkable other than some slight erythema in the posterior oropharynx.  He denies any exposure to strep

## 2023-03-08 ENCOUNTER — Ambulatory Visit: Payer: 59 | Admitting: Family Medicine

## 2023-07-13 ENCOUNTER — Emergency Department (HOSPITAL_COMMUNITY)

## 2023-07-13 ENCOUNTER — Other Ambulatory Visit: Payer: Self-pay

## 2023-07-13 ENCOUNTER — Emergency Department (HOSPITAL_COMMUNITY)
Admission: EM | Admit: 2023-07-13 | Discharge: 2023-07-14 | Disposition: A | Discharge status: Home / Self Care | Attending: Emergency Medicine | Admitting: Emergency Medicine

## 2023-07-13 DIAGNOSIS — N50812 Left testicular pain: Secondary | ICD-10-CM | POA: Diagnosis present

## 2023-07-13 NOTE — ED Triage Notes (Signed)
 Presents ambulatory with c/o left testicular pain and swelling starting today after hitting the area. States the past few week his testicles have been dropping further than usual. States he can feel something like a mass in this area as well.

## 2023-07-13 NOTE — ED Provider Notes (Signed)
   [place] EMERGENCY DEPARTMENT AT [facility]  Provider Note  CSN: [id number] Arrival date & time: 07/13/23 2247  History Chief Complaint  Patient presents with   Testicle Pain    Benjamin Bradshaw is a 23 y.o. male here with wife for evaluation of sudden onset left testicular pain a few hours ago. States he went to adjust his scrotum with his hand when he had sudden onset of pain. He noticed a tender area adjacent to the testicle. Denies any recent dysuria. No other trauma. He states recently he felt like his testicles have been 'hanging lower' but otherwise he has been in his usual state of health.   Home Medications Prior to Admission medications   Medication Sig Start Date End Date Taking? Authorizing Provider  oxyCODONE -acetaminophen  (PERCOCET/ROXICET) 5-325 MG tablet Take 1 tablet by mouth every 6 (six) hours as needed for severe pain. Patient not taking: Reported on 11/01/2022 07/14/22   Henderly, Britni A, PA-C  traZODone  (DESYREL ) 50 MG tablet Take 1 tablet (50 mg total) by mouth at bedtime. Patient not taking: Reported on 11/01/2022 07/27/22   Austine Lefort, MD     Allergies    Nutritional supplements and Zyrtec [cetirizine hcl]   Review of Systems   Review of Systems Please see HPI for pertinent positives and negatives  Physical Exam BP (!) 173/95   Pulse 74   Temp 97.6 F (36.4 C) (Temporal)   Resp 16   Ht 5\' 8"  (1.727 m)   Wt 83.9 kg   SpO2 99%   BMI 28.13 kg/m   Physical Exam Vitals and nursing note reviewed.  HENT:     Head: Normocephalic.     Nose: Nose normal.  Eyes:     Extraocular Movements: Extraocular movements intact.  Pulmonary:     Effort: Pulmonary effort is normal.  Genitourinary:    Penis: Normal.      Comments: R testicle is non tender. L testicle with some diffuse tenderness, including epididymis.  Musculoskeletal:        General: Normal range of motion.     Cervical back: Neck supple.  Skin:    Findings: No rash (on  exposed skin).  Neurological:     Mental Status: He is alert and oriented to person, place, and time.  Psychiatric:        Mood and Affect: Mood normal.     ED Results / Procedures / Treatments   EKG None  Procedures Procedures  Medications Ordered in the ED Medications - No data to display  Initial Impression and Plan  Patient here with sudden onset of testicular pain. Will check UA and US  for torsion.   ED Course       MDM Rules/Calculators/A&P Medical Decision Making Amount and/or Complexity of Data Reviewed Radiology: ordered.     Final Clinical Impression(s) / ED Diagnoses Final diagnoses:  None    Rx / DC Orders ED Discharge Orders     None

## 2023-07-14 ENCOUNTER — Encounter (HOSPITAL_COMMUNITY): Payer: Self-pay | Admitting: Emergency Medicine

## 2023-07-14 LAB — URINALYSIS, W/ REFLEX TO CULTURE (INFECTION SUSPECTED)
Bacteria, UA: NONE SEEN
Bilirubin Urine: NEGATIVE
Glucose, UA: NEGATIVE mg/dL
Hgb urine dipstick: NEGATIVE
Ketones, ur: NEGATIVE mg/dL
Leukocytes,Ua: NEGATIVE
Nitrite: NEGATIVE
Protein, ur: NEGATIVE mg/dL
Specific Gravity, Urine: 1.016 (ref 1.005–1.030)
pH: 6 (ref 5.0–8.0)

## 2023-07-14 MED ORDER — IBUPROFEN 400 MG PO TABS
600.0000 mg | ORAL_TABLET | Freq: Once | ORAL | Status: AC
  Administered 2023-07-14: 600 mg via ORAL
  Filled 2023-07-14: qty 2

## 2024-01-13 ENCOUNTER — Ambulatory Visit: Admitting: Family Medicine

## 2024-01-13 VITALS — BP 132/68 | HR 68 | Temp 99.0°F | Ht 68.0 in | Wt 192.4 lb

## 2024-01-13 DIAGNOSIS — R5383 Other fatigue: Secondary | ICD-10-CM

## 2024-01-13 DIAGNOSIS — F419 Anxiety disorder, unspecified: Secondary | ICD-10-CM

## 2024-01-13 MED ORDER — HYDROXYZINE HCL 25 MG PO TABS: 25.0000 mg | ORAL_TABLET | Freq: Every evening | ORAL | 0 refills | Status: DC | PRN

## 2024-01-13 NOTE — Progress Notes (Signed)
 Subjective:    Patient ID: Benjamin Bradshaw, male    DOB: 04-Jan-2001, 23 y.o.   MRN: [id number]  HPI Patient is a very pleasant 23 year old Caucasian gentleman who presents today endorsing trouble sleeping.  He states that it is hard for him to turn his mind off at night.  He also reports feeling guilty at times like he is not doing enough for his family even though he is working hard in providing for them.  From what I gather from our conversation, he feels anxious at times for no reason.  At other times he feels a sense of anhedonia and lack of purpose.  Sometimes he feels stuck in his job.  He is happily married.  He has 1 child who is healthy.  These are not causing him any stress or concerns.  He denies a sense of depression.  However he does have anxiety attacks.  He denies any suicidal thoughts.  He denies any mania.  However I get an underlying sense of sadness and listlessness as some of his symptoms.  He is requesting lab work and medication to help him sleep.  Has tried trazodone  in the past. Past Medical History:  Diagnosis Date   Concussion    Enlarged heart    per mom, diagnosed on CXR, Evaluated with nml echo at [facility]   Fracture of thumb, right, closed    Left radial fracture    No past surgical history on file. Current Outpatient Medications on File Prior to Visit  Medication Sig Dispense Refill   oxyCODONE -acetaminophen  (PERCOCET/ROXICET) 5-325 MG tablet Take 1 tablet by mouth every 6 (six) hours as needed for severe pain. (Patient not taking: Reported on 11/01/2022) 10 tablet 0   traZODone  (DESYREL ) 50 MG tablet Take 1 tablet (50 mg total) by mouth at bedtime. (Patient not taking: Reported on 11/01/2022) 30 tablet 2   No current facility-administered medications on file prior to visit.   Allergies  Allergen Reactions   Nutritional Supplements     swelling   Zyrtec [Cetirizine Hcl]     Hives    Social History   Socioeconomic History   Marital status: Married     Spouse name: Not on file   Number of children: Not on file   Years of education: Not on file   Highest education level: 12th grade  Occupational History   Not on file  Tobacco Use   Smoking status: Never   Smokeless tobacco: Never  Substance and Sexual Activity   Alcohol use: No   Drug use: No   Sexual activity: Never  Other Topics Concern   Not on file  Social History Narrative   Not on file   Social Drivers of Health   Financial Resource Strain: Patient Declined (01/13/2024)   Overall Financial Resource Strain (CARDIA)    Difficulty of Paying Living Expenses: Patient declined  Food Insecurity: Food Insecurity Present (01/13/2024)   Hunger Vital Sign    Worried About Running Out of Food in the Last Year: Sometimes true    Ran Out of Food in the Last Year: Sometimes true  Transportation Needs: No Transportation Needs (01/13/2024)   PRAPARE - Administrator, Civil Service (Medical): No    Lack of Transportation (Non-Medical): No  Physical Activity: Insufficiently Active (01/13/2024)   Exercise Vital Sign    Days of Exercise per Week: 4 days    Minutes of Exercise per Session: 30 min  Stress: Stress Concern Present (01/13/2024)  [employer] of Occupational Health - Occupational Stress Questionnaire    Feeling of Stress: Rather much  Social Connections: Moderately Integrated (01/13/2024)   Social Connection and Isolation Panel    Frequency of Communication with Friends and Family: Once a week    Frequency of Social Gatherings with Friends and Family: Once a week    Attends Religious Services: More than 4 times per year    Active Member of [employer] or Organizations: Yes    Attends Engineer, Structural: More than 4 times per year    Marital Status: Married  Catering Manager Violence: Not on file     Past Medical History:  Diagnosis Date   Concussion    Enlarged heart    per mom, diagnosed on CXR, Evaluated with nml echo at [facility]   Fracture  of thumb, right, closed    Left radial fracture    No past surgical history on file. Current Outpatient Medications on File Prior to Visit  Medication Sig Dispense Refill   oxyCODONE -acetaminophen  (PERCOCET/ROXICET) 5-325 MG tablet Take 1 tablet by mouth every 6 (six) hours as needed for severe pain. (Patient not taking: Reported on 11/01/2022) 10 tablet 0   traZODone  (DESYREL ) 50 MG tablet Take 1 tablet (50 mg total) by mouth at bedtime. (Patient not taking: Reported on 11/01/2022) 30 tablet 2   No current facility-administered medications on file prior to visit.   Allergies  Allergen Reactions   Nutritional Supplements     swelling   Zyrtec [Cetirizine Hcl]     Hives    Social History   Socioeconomic History   Marital status: Married    Spouse name: Not on file   Number of children: Not on file   Years of education: Not on file   Highest education level: 12th grade  Occupational History   Not on file  Tobacco Use   Smoking status: Never   Smokeless tobacco: Never  Substance and Sexual Activity   Alcohol use: No   Drug use: No   Sexual activity: Never  Other Topics Concern   Not on file  Social History Narrative   Not on file   Social Drivers of Health   Financial Resource Strain: Patient Declined (01/13/2024)   Overall Financial Resource Strain (CARDIA)    Difficulty of Paying Living Expenses: Patient declined  Food Insecurity: Food Insecurity Present (01/13/2024)   Hunger Vital Sign    Worried About Running Out of Food in the Last Year: Sometimes true    Ran Out of Food in the Last Year: Sometimes true  Transportation Needs: No Transportation Needs (01/13/2024)   PRAPARE - Administrator, Civil Service (Medical): No    Lack of Transportation (Non-Medical): No  Physical Activity: Insufficiently Active (01/13/2024)   Exercise Vital Sign    Days of Exercise per Week: 4 days    Minutes of Exercise per Session: 30 min  Stress: Stress Concern Present  (01/13/2024)   [employer] of Occupational Health - Occupational Stress Questionnaire    Feeling of Stress: Rather much  Social Connections: Moderately Integrated (01/13/2024)   Social Connection and Isolation Panel    Frequency of Communication with Friends and Family: Once a week    Frequency of Social Gatherings with Friends and Family: Once a week    Attends Religious Services: More than 4 times per year    Active Member of [employer] or Organizations: Yes    Attends Banker Meetings: More than 4 times  per year    Marital Status: Married  Catering Manager Violence: Not on file     Review of Systems  All other systems reviewed and are negative.      Objective:   Physical Exam Vitals reviewed.  Constitutional:      General: He is not in acute distress.    Appearance: Normal appearance. He is well-developed and normal weight. He is not ill-appearing, toxic-appearing or diaphoretic.  HENT:     Head: Normocephalic and atraumatic.  Neck:     Vascular: No JVD.  Cardiovascular:     Rate and Rhythm: Normal rate and regular rhythm.     Heart sounds: Normal heart sounds. No murmur heard.    No friction rub. No gallop.  Pulmonary:     Effort: Pulmonary effort is normal. No respiratory distress.     Breath sounds: Normal breath sounds. No stridor. No wheezing or rales.  Chest:     Chest wall: No tenderness.  Abdominal:     General: Abdomen is flat. There is no distension.     Palpations: Abdomen is soft. There is no mass.     Tenderness: There is no abdominal tenderness. There is no guarding or rebound.     Hernia: No hernia is present.  Musculoskeletal:     Cervical back: Neck supple.  Lymphadenopathy:     Cervical: No cervical adenopathy.  Neurological:     General: No focal deficit present.     Mental Status: He is alert and oriented to person, place, and time.     Motor: No abnormal muscle tone.  Psychiatric:        Mood and Affect: Mood normal.         Behavior: Behavior normal.        Thought Content: Thought content normal.        Judgment: Judgment normal.           Assessment & Plan:  Other fatigue - Plan: CBC with Differential/Platelet, Comprehensive metabolic panel with GFR, TSH, Vitamin B12 We spent the entire visit today talking.  I feel that the patient is dealing with some anxiety and perhaps a mild underlying depression although not clinical depression.  He would like a referral to a therapist for counseling.  I believe that this is an excellent idea.  I will give him hydroxyzine 25 mg every night as needed for insomnia.  Also recommended checking a CBC a CMP thyroid test and a B12 to evaluate for other potential causes to be.

## 2024-01-14 ENCOUNTER — Encounter: Payer: Self-pay | Admitting: Family Medicine

## 2024-01-14 ENCOUNTER — Ambulatory Visit: Payer: Self-pay | Admitting: Family Medicine

## 2024-01-14 ENCOUNTER — Other Ambulatory Visit: Payer: Self-pay

## 2024-01-14 DIAGNOSIS — R7989 Other specified abnormal findings of blood chemistry: Secondary | ICD-10-CM

## 2024-01-15 LAB — CBC WITH DIFFERENTIAL/PLATELET
Absolute Lymphocytes: 1608 {cells}/uL (ref 850–3900)
Absolute Monocytes: 490 {cells}/uL (ref 200–950)
Basophils Absolute: 28 {cells}/uL (ref 0–200)
Basophils Relative: 0.4 %
Eosinophils Absolute: 69 {cells}/uL (ref 15–500)
Eosinophils Relative: 1 %
HCT: 48 % (ref 38.5–50.0)
Hemoglobin: 16.2 g/dL (ref 13.2–17.1)
MCH: 30.3 pg (ref 27.0–33.0)
MCHC: 33.8 g/dL (ref 32.0–36.0)
MCV: 89.9 fL (ref 80.0–100.0)
MPV: 10.1 fL (ref 7.5–12.5)
Monocytes Relative: 7.1 %
Neutro Abs: 4706 {cells}/uL (ref 1500–7800)
Neutrophils Relative %: 68.2 %
Platelets: 296 Thousand/uL (ref 140–400)
RBC: 5.34 Million/uL (ref 4.20–5.80)
RDW: 12.5 % (ref 11.0–15.0)
Total Lymphocyte: 23.3 %
WBC: 6.9 Thousand/uL (ref 3.8–10.8)

## 2024-01-15 LAB — TEST AUTHORIZATION

## 2024-01-15 LAB — COMPREHENSIVE METABOLIC PANEL WITH GFR
AG Ratio: 2.2 (calc) (ref 1.0–2.5)
ALT: 89 U/L — ABNORMAL HIGH (ref 9–46)
AST: 146 U/L — ABNORMAL HIGH (ref 10–40)
Albumin: 5.2 g/dL — ABNORMAL HIGH (ref 3.6–5.1)
Alkaline phosphatase (APISO): 81 U/L (ref 36–130)
BUN: 14 mg/dL (ref 7–25)
CO2: 26 mmol/L (ref 20–32)
Calcium: 10 mg/dL (ref 8.6–10.3)
Chloride: 101 mmol/L (ref 98–110)
Creat: 1.12 mg/dL (ref 0.60–1.24)
Globulin: 2.4 g/dL (ref 1.9–3.7)
Glucose, Bld: 103 mg/dL — ABNORMAL HIGH (ref 65–99)
Potassium: 4.4 mmol/L (ref 3.5–5.3)
Sodium: 137 mmol/L (ref 135–146)
Total Bilirubin: 0.5 mg/dL (ref 0.2–1.2)
Total Protein: 7.6 g/dL (ref 6.1–8.1)
eGFR: 95 mL/min/1.73m2 (ref >=60)

## 2024-01-15 LAB — HEPATITIS PANEL, ACUTE
Hep A IgM: NONREACTIVE
Hep B C IgM: NONREACTIVE
Hepatitis B Surface Ag: NONREACTIVE
Hepatitis C Ab: NONREACTIVE

## 2024-01-15 LAB — VITAMIN B12: Vitamin B-12: 580 pg/mL (ref 200–1100)

## 2024-01-15 LAB — TSH: TSH: 4.12 m[IU]/L (ref 0.40–4.50)

## 2024-01-28 ENCOUNTER — Ambulatory Visit
Admission: RE | Admit: 2024-01-28 | Discharge: 2024-01-28 | Disposition: A | Discharge status: Home / Self Care | Source: Ambulatory Visit | Attending: Family Medicine | Admitting: Family Medicine

## 2024-01-28 DIAGNOSIS — R7989 Other specified abnormal findings of blood chemistry: Secondary | ICD-10-CM

## 2024-01-30 ENCOUNTER — Ambulatory Visit: Payer: Self-pay | Admitting: Family Medicine

## 2024-03-13 ENCOUNTER — Other Ambulatory Visit: Payer: Self-pay | Admitting: Family Medicine
